# Patient Record
Sex: Male | Born: 1970 | Race: White | Hispanic: No | Marital: Single | Smoking: Current every day smoker
Health system: Southern US, Community
[De-identification: ages and names within clinical notes are randomized; demographics above are authoritative.]

## PROBLEM LIST (undated history)

## (undated) DIAGNOSIS — G4719 Other hypersomnia: Secondary | ICD-10-CM

## (undated) DIAGNOSIS — R200 Anesthesia of skin: Secondary | ICD-10-CM

## (undated) DIAGNOSIS — I10 Essential (primary) hypertension: Secondary | ICD-10-CM

---
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
## 2007-10-24 ENCOUNTER — Emergency Department: Payer: Self-pay | Admitting: Emergency Medicine

## 2017-01-13 ENCOUNTER — Other Ambulatory Visit: Payer: Self-pay

## 2017-01-13 ENCOUNTER — Ambulatory Visit
Admission: EM | Admit: 2017-01-13 | Discharge: 2017-01-13 | Disposition: A | Discharge status: Home / Self Care | Payer: BC Managed Care – PPO | Attending: Family Medicine | Admitting: Family Medicine

## 2017-01-13 ENCOUNTER — Encounter: Payer: Self-pay | Admitting: Emergency Medicine

## 2017-01-13 DIAGNOSIS — J069 Acute upper respiratory infection, unspecified: Secondary | ICD-10-CM | POA: Diagnosis not present

## 2017-01-13 DIAGNOSIS — H6091 Unspecified otitis externa, right ear: Secondary | ICD-10-CM

## 2017-01-13 DIAGNOSIS — H60501 Unspecified acute noninfective otitis externa, right ear: Secondary | ICD-10-CM

## 2017-01-13 DIAGNOSIS — H6691 Otitis media, unspecified, right ear: Secondary | ICD-10-CM | POA: Diagnosis not present

## 2017-01-13 MED ORDER — AMOXICILLIN 875 MG PO TABS: 875.0000 mg | ORAL_TABLET | Freq: Two times a day (BID) | ORAL | 0 refills | Status: DC

## 2017-01-13 MED ORDER — BENZONATATE 100 MG PO CAPS: 100.0000 mg | ORAL_CAPSULE | Freq: Three times a day (TID) | ORAL | 0 refills | Status: DC | PRN

## 2017-01-13 MED ORDER — CIPROFLOXACIN-DEXAMETHASONE 0.3-0.1 % OT SUSP: 4.0000 [drp] | Freq: Two times a day (BID) | OTIC | 0 refills | Status: DC

## 2017-01-13 NOTE — ED Triage Notes (Signed)
Patient c/o sinus pain and pressure that started on Friday.

## 2017-01-13 NOTE — ED Provider Notes (Signed)
[facility] ____________________________________________  Time seen: Approximately 2:43 PM  I have reviewed the triage vital signs and the nursing notes.   HISTORY  Chief Complaint Sinus Problem   HPI Dominic Barton is a 46 y.o. male presenting for evaluation of 3 days of runny nose, nasal congestion, bilateral ear discomfort and cough.  Also reports some sore throat.  States right ear with mild to moderate pain intermittently with some decreased hearing.  Denies trauma, drainage or bleeding.  States unresolved with over-the-counter cough and congestion medications.  Patient reports that he does sometimes have sinus issues, and reports the last few weeks he has noticed some occasional right ear pain, but no right ear continued discomfort.  Denies any known sick contacts.  Reports continues to eat and drink well.  Denies known fevers.  Reports is continue to remain active.  Denies other aggravating or alleviating factors.Denies chest pain, shortness of breath, abdominal pain,or rash. Denies recent sickness. Denies recent antibiotic use.     History reviewed. No pertinent past medical history. Denies  There are no active problems to display for this patient.   History reviewed. No pertinent surgical history.   No current facility-administered medications for this encounter.   Current Outpatient Medications:  .  amoxicillin (AMOXIL) 875 MG tablet, Take 1 tablet (875 mg total) 2 (two) times daily by mouth., Disp: 20 tablet, Rfl: 0 .  benzonatate (TESSALON PERLES) 100 MG capsule, Take 1 capsule (100 mg total) 3 (three) times daily as needed by mouth for cough., Disp: 15 capsule, Rfl: 0 .  ciprofloxacin-dexamethasone (CIPRODEX) OTIC suspension, Place 4 drops 2 (two) times daily into the right ear. For 7 days, Disp: 7.5 mL, Rfl: 0  Allergies Patient has no known allergies.  History reviewed. No pertinent family history.  Social History Social History   Tobacco Use    . Smoking status: Never Smoker  . Smokeless tobacco: Never Used  Substance Use Topics  . Alcohol use: No    Frequency: Never  . Drug use: No    Review of Systems Constitutional: No fever/chills ENT: As above.  Cardiovascular: Denies chest pain. Respiratory: Denies shortness of breath. Gastrointestinal: No abdominal pain.  Musculoskeletal: Negative for back pain. Skin: Negative for rash.   ____________________________________________   PHYSICAL EXAM:  VITAL SIGNS: ED Triage Vitals  Enc Vitals Group     BP 01/13/17 1211 (!) 142/80     Pulse Rate 01/13/17 1211 88     Resp 01/13/17 1211 16     Temp 01/13/17 1211 99.3 F (37.4 C)     Temp Source 01/13/17 1211 Oral     SpO2 01/13/17 1211 98 %     Weight 01/13/17 1209 235 lb (106.6 kg)     Height 01/13/17 1209 5\' 7"  (1.702 m)     Head Circumference --      Peak Flow --      Pain Score 01/13/17 1210 0     Pain Loc --      Pain Edu? --      Excl. in GC? --     Constitutional: Alert and oriented. Well appearing and in no acute distress. Eyes: Conjunctivae are normal.  Head: Atraumatic.Minimal tenderness to palpation bilateral frontal and maxillary sinuses. No swelling. No erythema.   Ears: Left: Nontender, normal canal, no erythema, normal TM.  Right: Nontender, mild swelling and drainage in canal, moderate TM erythema, unable to fully visualize full TM no TM rupture visible.  No surrounding tenderness, swelling  or erythema bilaterally.  No mastoid tenderness bilaterally.  Nose: nasal congestion with bilateral nasal turbinate erythema and edema.   Mouth/Throat: Mucous membranes are moist.  Oropharynx non-erythematous.No tonsillar swelling or exudate.  Neck: No stridor.  No cervical spine tenderness to palpation. Hematological/Lymphatic/Immunilogical: No cervical lymphadenopathy. Cardiovascular: Normal rate, regular rhythm. Grossly normal heart sounds.  Good peripheral circulation. Respiratory: Normal respiratory effort.   No retractions.  No wheezes, rales or rhonchi. Good air movement.  Musculoskeletal: No cervical, thoracic or lumbar tenderness to palpation.  Neurologic:  Normal speech and language. No gross focal neurologic deficits are appreciated. No gait instability. Skin:  Skin is warm, dry and intact. No rash noted. Psychiatric: Mood and affect are normal. Speech and behavior are normal.  ___________________________________________   LABS (all labs ordered are listed, but only abnormal results are displayed)  Labs Reviewed - No data to display ____________________________________________   PROCEDURES Procedures   INITIAL IMPRESSION / ASSESSMENT AND PLAN / ED COURSE  Pertinent labs & imaging results that were available during my care of the patient were reviewed by me and considered in my medical decision making (see chart for details).  Well-appearing patient.  No acute distress.  Suspect viral upper respiratory infection.  Also noted right otitis externa and right otitis media.  Will treat with oral amoxicillin and Ciprodex.  Encouraged rest, fluids, supportive care.Discussed indication, risks and benefits of medications with patient.  Discussed follow up with Primary care physician this week. Discussed follow up and return parameters including no resolution or any worsening concerns. Patient verbalized understanding and agreed to plan.   ____________________________________________   FINAL CLINICAL IMPRESSION(S) / ED DIAGNOSES  Final diagnoses:  Right otitis media, unspecified otitis media type  Acute otitis externa of right ear, unspecified type  Acute upper respiratory infection     ED Discharge Orders        Ordered    benzonatate (TESSALON PERLES) 100 MG capsule  3 times daily PRN     01/13/17 1242    amoxicillin (AMOXIL) 875 MG tablet  2 times daily     01/13/17 1242    ciprofloxacin-dexamethasone (CIPRODEX) OTIC suspension  2 times daily     01/13/17 1242       Note:  This dictation was prepared with Dragon dictation along with smaller phrase technology. Any transcriptional errors that result from this process are unintentional.         Renford DillsMiller, Sakira Dahmer, NP 01/13/17 1447

## 2017-01-13 NOTE — Discharge Instructions (Signed)
Take medication as prescribed. Rest. Drink plenty of fluids.  ° °Follow up with your primary care physician this week as needed. Return to Urgent care for new or worsening concerns.  ° °

## 2019-02-26 DIAGNOSIS — M778 Other enthesopathies, not elsewhere classified: Secondary | ICD-10-CM

## 2019-02-26 HISTORY — DX: Other enthesopathies, not elsewhere classified: M77.8

## 2020-01-26 DIAGNOSIS — D649 Anemia, unspecified: Secondary | ICD-10-CM

## 2020-01-26 HISTORY — DX: Anemia, unspecified: D64.9

## 2020-02-23 ENCOUNTER — Other Ambulatory Visit: Payer: Self-pay | Admitting: Surgery

## 2020-03-02 ENCOUNTER — Encounter
Admission: RE | Admit: 2020-03-02 | Discharge: 2020-03-02 | Disposition: A | Discharge status: Home / Self Care | Payer: BC Managed Care – PPO | Source: Ambulatory Visit | Attending: Surgery | Admitting: Surgery

## 2020-03-02 ENCOUNTER — Other Ambulatory Visit: Payer: Self-pay

## 2020-03-02 DIAGNOSIS — G4730 Sleep apnea, unspecified: Secondary | ICD-10-CM

## 2020-03-02 HISTORY — DX: Anesthesia of skin: R20.0

## 2020-03-02 HISTORY — DX: Essential (primary) hypertension: I10

## 2020-03-02 HISTORY — DX: Sleep apnea, unspecified: G47.30

## 2020-03-02 HISTORY — DX: Other hypersomnia: G47.19

## 2020-03-02 NOTE — Patient Instructions (Addendum)
INSTRUCTIONS FOR SURGERY     Your surgery is scheduled for:   Wednesday, January 12TH, 2022     To find out your arrival time for the day of surgery,          please call [phone] between 1 pm and 3 pm on :  Tuesday, January 11TH, 2022     When you arrive for surgery, report to THE [facility]ALL. Registration will check you in then     Will send you to the second floor surgical desk. Check in there for surgery.         REMEMBER: Instructions that are not followed completely may result in serious medical risk,  up to and including death, or upon the discretion of your surgeon and anesthesiologist,            your surgery may need to be rescheduled.  __X__ 1. Do not eat food after midnight the night before your procedure.                    No gum, candy, lozenger, tic tacs, tums or hard candies.                  ABSOLUTELY NOTHING SOLID IN YOUR MOUTH AFTER MIDNIGHT                    You may drink unlimited clear liquids up to 2 hours before you are scheduled to arrive for surgery.                   Do not drink anything within those 2 hours unless you need to take medicine, then take the                   smallest amount you need.  Clear liquids include:  water, apple juice without pulp,                   any flavor Gatorade, Black coffee, black tea.  Sugar may be added but no dairy/ honey /lemon.                        Broth and jello is not considered a clear liquid.  __x__  2. On the morning of surgery, please brush your teeth with toothpaste and water. You may rinse with                  mouthwash if you wish but DO NOT SWALLOW TOOTHPASTE OR MOUTHWASH  __X___3. NO alcohol for 24 hours before or after surgery.  __x___ 4.  Do NOT smoke or use e-cigarettes for 24 HOURS PRIOR TO SURGERY.                      DO NOT Use any chewable tobacco products for at least 6 hours prior to surgery.  __x___ 5. If you start any new  medication after this appointment and prior to surgery, please                   Bring it with you on the day of surgery.  ___x__ 6. Notify your doctor if there is any change in your medical condition, such as fever,                  infection, vomitting, diarrhea or any open sores.  __x___ 7.  USE the CHG SOAP as instructed, the night before surgery and the day of surgery.                   Once you have washed with this soap, do NOT use any of the following: Powders, perfumes                    or lotions. Please do not wear make up, hairpins, clips or nail polish. You may wear deodorant.                   Men may shave their face and neck.  Women need to shave 48 hours prior to surgery.                   DO NOT wear ANY jewelry on the day of surgery. If there are rings that are too tight to                    remove easily, please address this prior to the surgery day. Piercings need to be removed.                                                                     NO METAL ON YOUR BODY.                    Do NOT bring any valuables.  If you came to Pre-Admit testing then you will not need license,                     insurance card or credit card.  If you will be staying overnight, please either leave your things in                     the car or have your family be responsible for these items.                     [facility] IS NOT RESPONSIBLE FOR BELONGINGS OR VALUABLES.  ___X__ 8. DO NOT wear contact lenses on surgery day.  You may not have dentures,                     Hearing aides, contacts or glasses in the operating room. These items can be                    Placed in the Recovery Room to receive immediately after surgery.  __x___ 9. IF YOU ARE SCHEDULED TO GO HOME ON THE SAME DAY, YOU MUST                   Have someone to drive you home and to stay with you  for the first 24 hours.                    Have an arrangement prior to arriving on  surgery day.  ___x__ 10. Take the  following medications on the morning of surgery with a sip of water:                              1.  nothing                     2.  __x___ 11.  Follow any instructions provided to you by your surgeon.                        Such as enema, clear liquid bowel prep                    PLEASE COMPLETE THE CARBOHYDRATE PRESURGICAL DRINK BY                      TWO HOURS PRIOR TO ARRIVAL TO THE HOSPITAL.  __X__  12. STOP ALL ASPIRIN PRODUCTS AS OF January 6TH, 2022.                       THIS INCLUDES BC POWDERS / GOODIES POWDER  __x___ 13. STOP Anti-inflammatories as of  January 6TH, 2022                      This includes IBUPROFEN / MOTRIN / ADVIL / ALEVE/ NAPROXYN                         MOBIC ALSO                    YOU MAY TAKE TYLENOL ANY TIME PRIOR TO SURGERY.  ___X__ 14.  Stop supplements until after surgery.                     This includes: VITAMIN C                 You may continue taking Vitamin B12 / MULTIVITAMINS but do not take                  on the morning of surgery.  ___X___17.  Continue to take the following medications but do not take on the morning of surgery:                          HYZAAR AND VITAMINS  ___X__18.   Wear clean and comfortable clothing to the hospital.                    HAVE A SHIRT WITH LOOSE OR STRETCHY CUFFS.

## 2020-03-06 ENCOUNTER — Other Ambulatory Visit: Payer: Self-pay

## 2020-03-06 ENCOUNTER — Other Ambulatory Visit
Admission: RE | Admit: 2020-03-06 | Discharge: 2020-03-06 | Disposition: A | Discharge status: Home / Self Care | Payer: BC Managed Care – PPO | Source: Ambulatory Visit | Attending: Surgery | Admitting: Surgery

## 2020-03-06 DIAGNOSIS — R7303 Prediabetes: Secondary | ICD-10-CM | POA: Diagnosis not present

## 2020-03-06 DIAGNOSIS — Z20822 Contact with and (suspected) exposure to covid-19: Secondary | ICD-10-CM | POA: Insufficient documentation

## 2020-03-06 DIAGNOSIS — Z01818 Encounter for other preprocedural examination: Secondary | ICD-10-CM | POA: Insufficient documentation

## 2020-03-06 DIAGNOSIS — G5602 Carpal tunnel syndrome, left upper limb: Secondary | ICD-10-CM | POA: Diagnosis not present

## 2020-03-06 DIAGNOSIS — I10 Essential (primary) hypertension: Secondary | ICD-10-CM | POA: Insufficient documentation

## 2020-03-06 DIAGNOSIS — Z79899 Other long term (current) drug therapy: Secondary | ICD-10-CM | POA: Diagnosis not present

## 2020-03-06 LAB — SARS CORONAVIRUS 2 (TAT 6-24 HRS): SARS Coronavirus 2: NEGATIVE

## 2020-03-08 ENCOUNTER — Ambulatory Visit: Payer: BC Managed Care – PPO | Admitting: Urgent Care

## 2020-03-08 ENCOUNTER — Encounter: Payer: Self-pay | Admitting: Surgery

## 2020-03-08 ENCOUNTER — Ambulatory Visit: Payer: BC Managed Care – PPO | Admitting: Certified Registered"

## 2020-03-08 ENCOUNTER — Encounter
Admission: RE | Disposition: A | Discharge status: Home / Self Care | Payer: Self-pay | Source: Home / Self Care | Attending: Surgery

## 2020-03-08 ENCOUNTER — Other Ambulatory Visit: Payer: Self-pay

## 2020-03-08 ENCOUNTER — Ambulatory Visit
Admission: RE | Admit: 2020-03-08 | Discharge: 2020-03-08 | Disposition: A | Discharge status: Home / Self Care | Payer: BC Managed Care – PPO | Attending: Surgery | Admitting: Surgery

## 2020-03-08 DIAGNOSIS — G5602 Carpal tunnel syndrome, left upper limb: Secondary | ICD-10-CM | POA: Diagnosis not present

## 2020-03-08 DIAGNOSIS — Z20822 Contact with and (suspected) exposure to covid-19: Secondary | ICD-10-CM | POA: Insufficient documentation

## 2020-03-08 DIAGNOSIS — Z79899 Other long term (current) drug therapy: Secondary | ICD-10-CM | POA: Insufficient documentation

## 2020-03-08 DIAGNOSIS — R7303 Prediabetes: Secondary | ICD-10-CM | POA: Insufficient documentation

## 2020-03-08 HISTORY — PX: CARPAL TUNNEL RELEASE: SHX101

## 2020-03-08 SURGERY — RELEASE, CARPAL TUNNEL, ENDOSCOPIC
Anesthesia: General | Site: Wrist | Laterality: Left

## 2020-03-08 MED ORDER — CHLORHEXIDINE GLUCONATE 0.12 % MT SOLN: 15.0000 mL | Freq: Once | OROMUCOSAL | Status: AC

## 2020-03-08 MED ORDER — FENTANYL CITRATE (PF) 100 MCG/2ML IJ SOLN
INTRAMUSCULAR | Status: AC
  Filled 2020-03-08: qty 2

## 2020-03-08 MED ORDER — PROPOFOL 10 MG/ML IV BOLUS
INTRAVENOUS | Status: DC | PRN
  Administered 2020-03-08 (×2): 50 mg via INTRAVENOUS
  Administered 2020-03-08: 200 mg via INTRAVENOUS

## 2020-03-08 MED ORDER — FENTANYL CITRATE (PF) 100 MCG/2ML IJ SOLN
INTRAMUSCULAR | Status: DC | PRN
  Administered 2020-03-08 (×2): 50 ug via INTRAVENOUS

## 2020-03-08 MED ORDER — LACTATED RINGERS IV SOLN: INTRAVENOUS | Status: DC

## 2020-03-08 MED ORDER — CEFAZOLIN SODIUM-DEXTROSE 2-4 GM/100ML-% IV SOLN
INTRAVENOUS | Status: AC
  Filled 2020-03-08: qty 100

## 2020-03-08 MED ORDER — ONDANSETRON HCL 4 MG/2ML IJ SOLN
INTRAMUSCULAR | Status: DC | PRN
  Administered 2020-03-08: 4 mg via INTRAVENOUS

## 2020-03-08 MED ORDER — PROPOFOL 10 MG/ML IV BOLUS
INTRAVENOUS | Status: AC
  Filled 2020-03-08: qty 20

## 2020-03-08 MED ORDER — OXYCODONE HCL 5 MG PO TABS: 5.0000 mg | ORAL_TABLET | Freq: Once | ORAL | Status: DC | PRN

## 2020-03-08 MED ORDER — FAMOTIDINE 20 MG PO TABS
ORAL_TABLET | ORAL | Status: AC
  Administered 2020-03-08: 20 mg via ORAL
  Filled 2020-03-08: qty 1

## 2020-03-08 MED ORDER — LIDOCAINE HCL (CARDIAC) PF 100 MG/5ML IV SOSY
PREFILLED_SYRINGE | INTRAVENOUS | Status: DC | PRN
  Administered 2020-03-08: 100 mg via INTRAVENOUS

## 2020-03-08 MED ORDER — ONDANSETRON HCL 4 MG PO TABS: 4.0000 mg | ORAL_TABLET | Freq: Four times a day (QID) | ORAL | Status: DC | PRN

## 2020-03-08 MED ORDER — MIDAZOLAM HCL 2 MG/2ML IJ SOLN
INTRAMUSCULAR | Status: AC
  Filled 2020-03-08: qty 2

## 2020-03-08 MED ORDER — MIDAZOLAM HCL 2 MG/2ML IJ SOLN
INTRAMUSCULAR | Status: DC | PRN
  Administered 2020-03-08: 2 mg via INTRAVENOUS

## 2020-03-08 MED ORDER — BUPIVACAINE HCL (PF) 0.5 % IJ SOLN
INTRAMUSCULAR | Status: DC | PRN
  Administered 2020-03-08: 10 mL

## 2020-03-08 MED ORDER — FAMOTIDINE 20 MG PO TABS: 20.0000 mg | ORAL_TABLET | Freq: Once | ORAL | Status: AC

## 2020-03-08 MED ORDER — CEFAZOLIN SODIUM-DEXTROSE 2-4 GM/100ML-% IV SOLN
2.0000 g | INTRAVENOUS | Status: AC
  Administered 2020-03-08: 2 g via INTRAVENOUS

## 2020-03-08 MED ORDER — HYDROMORPHONE HCL 1 MG/ML IJ SOLN: 0.2500 mg | INTRAMUSCULAR | Status: DC | PRN

## 2020-03-08 MED ORDER — PROMETHAZINE HCL 25 MG/ML IJ SOLN: 6.2500 mg | INTRAMUSCULAR | Status: DC | PRN

## 2020-03-08 MED ORDER — METOCLOPRAMIDE HCL 10 MG PO TABS: 5.0000 mg | ORAL_TABLET | Freq: Three times a day (TID) | ORAL | Status: DC | PRN

## 2020-03-08 MED ORDER — METOCLOPRAMIDE HCL 5 MG/ML IJ SOLN: 5.0000 mg | Freq: Three times a day (TID) | INTRAMUSCULAR | Status: DC | PRN

## 2020-03-08 MED ORDER — DROPERIDOL 2.5 MG/ML IJ SOLN
0.6250 mg | Freq: Once | INTRAMUSCULAR | Status: DC | PRN
  Filled 2020-03-08: qty 2

## 2020-03-08 MED ORDER — ONDANSETRON HCL 4 MG/2ML IJ SOLN: 4.0000 mg | Freq: Four times a day (QID) | INTRAMUSCULAR | Status: DC | PRN

## 2020-03-08 MED ORDER — LORAZEPAM 2 MG/ML IJ SOLN: 1.0000 mg | Freq: Once | INTRAMUSCULAR | Status: DC | PRN

## 2020-03-08 MED ORDER — MEPERIDINE HCL 50 MG/ML IJ SOLN: 6.2500 mg | INTRAMUSCULAR | Status: DC | PRN

## 2020-03-08 MED ORDER — POTASSIUM CHLORIDE IN NACL 20-0.9 MEQ/L-% IV SOLN: INTRAVENOUS | Status: DC

## 2020-03-08 MED ORDER — ORAL CARE MOUTH RINSE: 15.0000 mL | Freq: Once | OROMUCOSAL | Status: AC

## 2020-03-08 MED ORDER — CHLORHEXIDINE GLUCONATE 0.12 % MT SOLN
OROMUCOSAL | Status: AC
  Administered 2020-03-08: 15 mL via OROMUCOSAL
  Filled 2020-03-08: qty 15

## 2020-03-08 MED ORDER — OXYCODONE HCL 5 MG/5ML PO SOLN: 5.0000 mg | Freq: Once | ORAL | Status: DC | PRN

## 2020-03-08 MED ORDER — PROPOFOL 10 MG/ML IV BOLUS
INTRAVENOUS | Status: AC
  Filled 2020-03-08: qty 40

## 2020-03-08 MED ORDER — TRAMADOL HCL 50 MG PO TABS: 50.0000 mg | ORAL_TABLET | Freq: Four times a day (QID) | ORAL | Status: DC | PRN

## 2020-03-08 MED ORDER — LACTATED RINGERS IV SOLN: INTRAVENOUS | Status: DC | PRN

## 2020-03-08 SURGICAL SUPPLY — 33 items
APL PRP STRL LF DISP 70% ISPRP (MISCELLANEOUS) ×1
BNDG COHESIVE 4X5 TAN STRL (GAUZE/BANDAGES/DRESSINGS) ×2 IMPLANT
BNDG ELASTIC 2X5.8 VLCR STR LF (GAUZE/BANDAGES/DRESSINGS) ×2 IMPLANT
BNDG ESMARK 4X12 TAN STRL LF (GAUZE/BANDAGES/DRESSINGS) ×2 IMPLANT
CANISTER SUCT 1200ML W/VALVE (MISCELLANEOUS) ×2 IMPLANT
CHLORAPREP W/TINT 26 (MISCELLANEOUS) ×2 IMPLANT
CORD BIP STRL DISP 12FT (MISCELLANEOUS) ×2 IMPLANT
COVER WAND RF STERILE (DRAPES) ×2 IMPLANT
CUFF TOURN SGL QUICK 18X4 (TOURNIQUET CUFF) ×2 IMPLANT
DRAPE SURG 17X11 SM STRL (DRAPES) ×2 IMPLANT
FORCEPS JEWEL BIP 4-3/4 STR (INSTRUMENTS) ×2 IMPLANT
GAUZE SPONGE 4X4 12PLY STRL (GAUZE/BANDAGES/DRESSINGS) ×2 IMPLANT
GAUZE XEROFORM 1X8 LF (GAUZE/BANDAGES/DRESSINGS) ×2 IMPLANT
GLOVE BIO SURGEON STRL SZ8 (GLOVE) ×2 IMPLANT
GLOVE INDICATOR 8.0 STRL GRN (GLOVE) ×2 IMPLANT
GOWN STRL REUS W/ TWL LRG LVL3 (GOWN DISPOSABLE) ×1 IMPLANT
GOWN STRL REUS W/ TWL XL LVL3 (GOWN DISPOSABLE) ×1 IMPLANT
GOWN STRL REUS W/TWL LRG LVL3 (GOWN DISPOSABLE) ×2
GOWN STRL REUS W/TWL XL LVL3 (GOWN DISPOSABLE) ×2
KIT CARPAL TUNNEL (MISCELLANEOUS) ×2
KIT ESCP INSRT D SLOT CANN KN (MISCELLANEOUS) ×1 IMPLANT
KIT TURNOVER KIT A (KITS) ×2 IMPLANT
MANIFOLD NEPTUNE II (INSTRUMENTS) ×2 IMPLANT
NS IRRIG 500ML POUR BTL (IV SOLUTION) ×2 IMPLANT
PACK EXTREMITY ARMC (MISCELLANEOUS) ×2 IMPLANT
SPLINT WRIST LG LT TX990309 (SOFTGOODS) IMPLANT
SPLINT WRIST LG RT TX900304 (SOFTGOODS) IMPLANT
SPLINT WRIST M LT TX990308 (SOFTGOODS) IMPLANT
SPLINT WRIST M RT TX990303 (SOFTGOODS) IMPLANT
SPLINT WRIST XL LT TX990310 (SOFTGOODS) ×2 IMPLANT
SPLINT WRIST XL RT TX990305 (SOFTGOODS) IMPLANT
STOCKINETTE IMPERVIOUS 9X36 MD (GAUZE/BANDAGES/DRESSINGS) ×2 IMPLANT
SUT PROLENE 4 0 PS 2 18 (SUTURE) ×2 IMPLANT

## 2020-03-08 NOTE — Discharge Instructions (Addendum)
Orthopedic discharge instructions: Keep dressing dry and intact. Keep hand elevated above heart level. May shower after dressing removed on postop day 4 (Monday). Cover sutures with Band-Aids after drying off. Apply ice to affected area frequently. Take Mobic 15 mg daily OR ibuprofen 600 mg TID with meals for 7-10 days, then as necessary. Take ES Tylenol or pain medication as prescribed when needed.  Return for follow-up in 10-14 days or as scheduled.

## 2020-03-08 NOTE — Transfer of Care (Signed)
Immediate Anesthesia Transfer of Care Note  Patient: Dominic Barton  Procedure(s) Performed: CARPAL TUNNEL RELEASE ENDOSCOPIC (Left Wrist)  Patient Location: PACU  Anesthesia Type:General  Level of Consciousness: awake, alert  and oriented  Airway & Oxygen Therapy: Patient Spontanous Breathing and Patient connected to face mask oxygen  Post-op Assessment: Report given to RN and Post -op Vital signs reviewed and stable  Post vital signs: Reviewed and stable  Last Vitals:  Vitals Value Taken Time  BP 151/85 03/08/20 1605  Temp    Pulse 78 03/08/20 1614  Resp 18 03/08/20 1614  SpO2 100 % 03/08/20 1614  Vitals shown include unvalidated device data.  Last Pain:  Vitals:   03/08/20 1356  TempSrc: Tympanic  PainSc: 0-No pain         Complications: No complications documented.

## 2020-03-08 NOTE — Op Note (Signed)
03/08/2020  3:55 PM  Patient:   Dominic Barton  Pre-Op Diagnosis:   Left carpal tunnel syndrome.  Post-Op Diagnosis:   Same.  Procedure:   Endoscopic left carpal tunnel release.  Surgeon:   Maryagnes Amos, MD  Anesthesia:   General LMA  Findings:   As above.  Complications:   None  EBL:   0 cc  Fluids:   350 cc crystalloid  TT:   18 minutes at 250 mmHg  Drains:   None  Closure:   4-0 Prolene interrupted sutures  Brief Clinical Note:   The patient is a 51 year old male with a long history of progressively worsening pain and paresthesias to his left hand. His symptoms have progressed despite medications, activity modification, etc. His history and examination are consistent with carpal tunnel syndrome, confirmed by EMG. The patient presents at this time for an endoscopic left carpal tunnel release.   Procedure:   The patient was brought into the operating room and lain in the supine position. After adequate general laryngeal mask anesthesia was obtained, the left hand and upper extremity were prepped with ChloraPrep solution before being draped sterilely. Preoperative antibiotics were administered. A timeout was performed to verify the appropriate surgical site before the limb was exsanguinated with an Esmarch and the tourniquet inflated to 250 mmHg. An approximately 1.5-2 cm incision was made over the volar wrist flexion crease, centered over the palmaris longus tendon. The incision was carried down through the subcutaneous tissues with care taken to identify and protect any neurovascular structures. The distal forearm fascia was penetrated just proximal to the transverse carpal ligament. The soft tissues were released off the superficial and deep surfaces of the distal forearm fascia and this was released proximally for 3-4 cm under direct visualization.  Attention was directed distally. The Therapist, nutritional was passed beneath the transverse carpal ligament along the ulnar aspect  of the carpal tunnel and used to release any adhesions as well as to remove any adherent synovial tissue before first the smaller then the larger of the two dilators were passed beneath the transverse carpal ligament along the ulnar margin of the carpal tunnel. The slotted cannula was introduced and the endoscope was placed into the slotted cannula and the undersurface of the transverse carpal ligament visualized. The distal margin of the transverse carpal ligament was marked by placing a 25-gauge needle percutaneously at Kaplan's cardinal point so that it entered the distal portion of the slotted cannula. Under endoscopic visualization, the transverse carpal ligament was released from proximal to distal using the end-cutting blade. A second pass was performed to ensure complete release of the ligament. The adequacy of release was verified both endoscopically and by palpation using the freer elevator.  The wound was irrigated thoroughly with sterile saline solution before being closed using 4-0 Prolene interrupted sutures. A total of 10 cc of 0.5% plain Sensorcaine was injected in and around the incision before a sterile bulky dressing was applied to the wound. The patient was placed into a volar wrist splint before being awakened and returned to the recovery room in satisfactory condition after tolerating the procedure well.

## 2020-03-08 NOTE — Anesthesia Postprocedure Evaluation (Signed)
Anesthesia Post Note  Patient: Dominic Barton  Procedure(s) Performed: CARPAL TUNNEL RELEASE ENDOSCOPIC (Left Wrist)  Patient location during evaluation: PACU Anesthesia Type: General Level of consciousness: awake and alert Pain management: pain level controlled Vital Signs Assessment: post-procedure vital signs reviewed and stable Respiratory status: spontaneous breathing, nonlabored ventilation, respiratory function stable and patient connected to nasal cannula oxygen Cardiovascular status: blood pressure returned to baseline and stable Postop Assessment: no apparent nausea or vomiting Anesthetic complications: no   No complications documented.   Last Vitals:  Vitals:   03/08/20 1650 03/08/20 1655  BP:  (!) 169/73  Pulse: 72 71  Resp: (!) 27 (!) 21  Temp: 36.6 C   SpO2: 100% 100%    Last Pain:  Vitals:   03/08/20 1638  TempSrc:   PainSc: 0-No pain                 Corinda Gubler

## 2020-03-08 NOTE — Anesthesia Preprocedure Evaluation (Signed)
Anesthesia Evaluation  Patient identified by MRN, date of birth, ID band Patient awake    Reviewed: Allergy & Precautions, H&P , NPO status , Patient's Chart, lab work & pertinent test results  Airway Mallampati: II       Dental no notable dental hx.    Pulmonary sleep apnea , Current Smoker and Patient abstained from smoking.,    Pulmonary exam normal breath sounds clear to auscultation       Cardiovascular hypertension, negative cardio ROS Normal cardiovascular exam Rhythm:Regular Rate:Normal     Neuro/Psych negative neurological ROS  negative psych ROS   GI/Hepatic negative GI ROS, Neg liver ROS,   Endo/Other  negative endocrine ROS  Renal/GU negative Renal ROS  negative genitourinary   Musculoskeletal negative musculoskeletal ROS (+)   Abdominal   Peds negative pediatric ROS (+)  Hematology negative hematology ROS (+)   Anesthesia Other Findings Past Medical History: 01/2020: Anemia     Comment:  bitamin b12 deficiency No date: Bilateral hand numbness No date: Excessive daytime sleepiness No date: Hypertension 03/02/2020: Sleep apnea     Comment:  in the process of being worked up for sleep apnea 2021: Tendonitis of shoulder, left   Reproductive/Obstetrics negative OB ROS                             Anesthesia Physical Anesthesia Plan  ASA: II  Anesthesia Plan: General   Post-op Pain Management:    Induction: Intravenous  PONV Risk Score and Plan: 1 and Ondansetron and Dexamethasone  Airway Management Planned: LMA  Additional Equipment:   Intra-op Plan:   Post-operative Plan: Extubation in OR  Informed Consent: I have reviewed the patients History and Physical, chart, labs and discussed the procedure including the risks, benefits and alternatives for the proposed anesthesia with the patient or authorized representative who has indicated his/her understanding and  acceptance.       Plan Discussed with: CRNA, Anesthesiologist and Surgeon  Anesthesia Plan Comments:         Anesthesia Quick Evaluation

## 2020-03-08 NOTE — H&P (Signed)
History of Present Illness:  Dominic Barton is a 50 y.o. male who has been referred by Dedra Skeens, PA-C, for evaluation and treatment of his bilateral hand and wrist pain and paresthesias. The patient notes that the symptoms have been present for many years, but have worsened over the past few months. 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 He notes that his symptoms are aggravated by these repetitive activities, especially while at work. He notes that even smaller activities will aggravate his symptoms and contribute to numbness. The patient also notes occasional paresthesias at night which he attributes to positioning his arm while sleeping. He saw Dedra Skeens, PA-C, for these symptoms who sent him for an EMG of both upper extremities and referred him to me for further evaluation and treatment. The patient denies any neck pain, but is a borderline diabetic.  Current Outpatient Medications:  . losartan-hydrochlorothiazide (HYZAAR) 50-12.5 mg tablet Take 1 tablet by mouth once daily 90 tablet 1  . multivitamin tablet Take 1 tablet by mouth once daily   Allergies: No Known Allergies  Past Medical History:  . Chronic left shoulder pain 01/03/2020  . Essential hypertension 01/03/2020  . Excessive daytime sleepiness 01/03/2020  . Paresthesia 01/03/2020   Surgical History: No past surgical history on file.   Family History  . No Known Problems Mother  . No Known Problems Father   Social History:   Socioeconomic History:  Marland Kitchen Marital status: Single  Spouse name: Not on file  . Number of children: 1  . Years of education: Not on file  . Highest education level: Not on file  Occupational History  . 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 Smoking status: Current Every Day Smoker  Packs/day: 1.50  Types: Cigarettes  .  Smokeless tobacco: Former Clinical biochemist  . Vaping Use: Never used  Substance and Sexual Activity  . Alcohol use: Yes  Alcohol/week: 7.0 standard drinks  Types: 7 Cans of beer per week  . Drug use: Never  . Sexual activity: Yes  Other Topics Concern  . Not on file  Social History Narrative  . Not on file   Social Determinants of Health:   Financial Resource Strain: Not on file  Food Insecurity: Not on file  Transportation Needs: Not on file   Review of Systems:  A comprehensive 14 point ROS was performed, reviewed, and the pertinent orthopaedic findings are documented in the HPI.  Physical Exam: Vitals:  02/21/20 0913  BP: 126/80  Weight: (!) 111.7 kg (246 lb 3.2 oz)  Height: 168.9 cm (5' 6.5")  PainSc: 0-No pain  PainLoc: Hand   General/Constitutional: Pleasant husky middle-aged male in no acute distress. Neuro/Psych: Normal mood and affect, oriented to person, place and time. Eyes: Non-icteric. Pupils are equal, round, and reactive to light, and exhibit synchronous movement. ENT: Unremarkable. Lymphatic: No palpable adenopathy. Respiratory: Lungs clear to auscultation, Normal chest excursion, No wheezes and Non-labored breathing Cardiovascular: Regular rate and rhythm with soft holosystolic murmur and No edema, swelling or tenderness, except as noted in detailed exam. Integumentary: No impressive skin lesions present, except as noted in detailed exam. Musculoskeletal: Unremarkable, except as noted in detailed exam.  Left hand/wrist exam: Skin inspection of the left wrist and hand is unremarkable. No swelling, erythema, ecchymosis, abrasions, or other skin abnormalities are identified.  He is able to tolerate wrist flexion to 55 degrees and extension to 60 degrees without any pain or catching. He is able active flex extend all digits fully without any pain or triggering. He is neurovascularly intact to all digits, other than slightly decreased sensation subjectively to  the tips of his thumb, index, long, and ring fingers. He has a negative Phalen's test, but with a mildly positive Tinel's test at the wrist.  EMG results:  A recent EMG of both upper extremities is available for review. By report, the study demonstrates evidence of severe chronic bilateral carpal tunnel syndrome. These results were reviewed by myself and discussed with the patient.  Assessment: Carpal tunnel syndrome, left.   Plan: The treatment options were discussed with the patient. In addition, patient educational materials were provided regarding the diagnosis and treatment options. The patient is quite frustrated by his symptoms and functional limitations, and is ready to consider more aggressive treatment options. Therefore, I have recommended a surgical procedure, specifically an endoscopic left carpal tunnel release. The procedure was discussed with the patient, as were the potential risks (including bleeding, infection, nerve and/or blood vessel injury, persistent or recurrent pain/paresthesias, weakness of grip, need for further surgery, blood clots, strokes, heart attacks and/or arhythmias, pneumonia, etc.) and benefits. The patient states his/her understanding and wishes to proceed. All of the patient's questions and concerns were answered. He can call any time with further concerns. He will follow up post-surgery, routine.    H&P reviewed and patient re-examined. No changes.

## 2020-03-09 ENCOUNTER — Encounter: Payer: Self-pay | Admitting: Surgery

## 2020-03-29 ENCOUNTER — Other Ambulatory Visit: Payer: Self-pay | Admitting: Surgery

## 2020-03-30 ENCOUNTER — Encounter
Admission: RE | Admit: 2020-03-30 | Discharge: 2020-03-30 | Disposition: A | Discharge status: Home / Self Care | Payer: BC Managed Care – PPO | Source: Ambulatory Visit | Attending: Emergency Medicine | Admitting: Emergency Medicine

## 2020-03-30 DIAGNOSIS — Z01818 Encounter for other preprocedural examination: Secondary | ICD-10-CM | POA: Insufficient documentation

## 2020-03-30 NOTE — Patient Instructions (Addendum)
Your procedure is scheduled on: 02/09/22[facility] Report to the Registration Desk on the 1st floor of the Medical Mall. To find out your arrival time, please call [phone] between 1PM - 3PM on: 04/04/20- TUESDAY  REMEMBER: Instructions that are not followed completely may result in serious medical risk, up to and including death; or upon the discretion of your surgeon and anesthesiologist your surgery may need to be rescheduled.  Do not eat food OR DRINK ANY FLUIDS after midnight the night before surgery.  No gum chewing, lozengers or hard candies.   your doctor has ordered for you to drink the provided  Ensure Pre-Surgery Clear Carbohydrate Drink  Drinking this carbohydrate drink up to two hours before surgery helps to reduce insulin resistance and improve patient outcomes. Please complete drinking 2 hours prior to scheduled arrival time.  TAKE THESE MEDICATIONS THE MORNING OF SURGERY WITH A SIP OF WATER: NONE   One week prior to surgery: STOP TAKING 03/30/20: Stop Anti-inflammatories (NSAIDS) such as Advil, Aleve, Ibuprofen, MELOXICAM, Motrin, Naproxen, Naprosyn and Aspirin based products such as Excedrin, Goodys Powder, BC Powder.  Stop  ANY OVER THE COUNTER supplements until after surgery, Ascorbic Acid (VITAMIN C) 1000 MG tablet (However, you may continue taking Vitamin D, Vitamin B, and multivitamin up until the day before surgery.)  No Alcohol for 24 hours before or after surgery.  No Smoking including e-cigarettes for 24 hours prior to surgery.  No chewable tobacco products for at least 6 hours prior to surgery.  No nicotine patches on the day of surgery.  Do not use any "recreational" drugs for at least a week prior to your surgery.  Please be advised that the combination of cocaine and anesthesia may have negative outcomes, up to and including death. If you test positive for cocaine, your surgery will be cancelled.  On the morning of surgery brush your teeth with  toothpaste and water, you may rinse your mouth with mouthwash if you wish. Do not swallow any toothpaste or mouthwash.  Do not wear jewelry, make-up, hairpins, clips or nail polish.  Do not wear lotions, powders, or perfumes.   Do not shave body from the neck down 48 hours prior to surgery just in case you cut yourself which could leave a site for infection.  Also, freshly shaved skin may become irritated if using the CHG soap.  Contact lenses, hearing aids and dentures may not be worn into surgery.  Do not bring valuables to the hospital. [facility] is not responsible for any missing/lost belongings or valuables.   Use CHG Soap or wipes as directed on instruction sheet.  Notify your doctor if there is any change in your medical condition (cold, fever, infection).  Wear comfortable clothing (specific to your surgery type) to the hospital.  Plan for stool softeners for home use; pain medications have a tendency to cause constipation. You can also help prevent constipation by eating foods high in fiber such as fruits and vegetables and drinking plenty of fluids as your diet allows.  After surgery, you can help prevent lung complications by doing breathing exercises.  Take deep breaths and cough every 1-2 hours. Your doctor may order a device called an Incentive Spirometer to help you take deep breaths. When coughing or sneezing, hold a pillow firmly against your incision with both hands. This is called "splinting." Doing this helps protect your incision. It also decreases belly discomfort.  If you are being admitted to the hospital overnight, leave your suitcase  in the car. After surgery it may be brought to your room.  If you are being discharged the day of surgery, you will not be allowed to drive home. You will need a responsible adult (18 years or older) to drive you home and stay with you that night.   If you are taking public transportation, you will need to have a responsible  adult (18 years or older) with you. Please confirm with your physician that it is acceptable to use public transportation.   Please call the Pre-admissions Testing Dept. at [phone] if you have any questions about these instructions.  Visitation Policy:  Patients undergoing a surgery or procedure may have one family member or support person with them as long as that person is not COVID-19 positive or experiencing its symptoms.  That person may remain in the waiting area during the procedure.  Inpatient Visitation:    Visiting hours are 7 a.m. to 8 p.m. Patients will be allowed one visitor. The visitor may change daily. The visitor must pass COVID-19 screenings, use hand sanitizer when entering and exiting the patient's room and wear a mask at all times, including in the patient's room. Patients must also wear a mask when staff or their visitor are in the room. Masking is required regardless of vaccination status. Systemwide, no visitors 17 or younger.

## 2020-04-03 ENCOUNTER — Other Ambulatory Visit: Payer: Self-pay

## 2020-04-03 ENCOUNTER — Other Ambulatory Visit
Admission: RE | Admit: 2020-04-03 | Discharge: 2020-04-03 | Disposition: A | Discharge status: Home / Self Care | Payer: BC Managed Care – PPO | Source: Ambulatory Visit | Attending: Surgery | Admitting: Surgery

## 2020-04-03 DIAGNOSIS — Z01812 Encounter for preprocedural laboratory examination: Secondary | ICD-10-CM | POA: Diagnosis not present

## 2020-04-03 DIAGNOSIS — U071 COVID-19: Secondary | ICD-10-CM | POA: Insufficient documentation

## 2020-04-04 LAB — SARS CORONAVIRUS 2 (TAT 6-24 HRS): SARS Coronavirus 2: POSITIVE — AB

## 2020-04-06 ENCOUNTER — Telehealth: Payer: Self-pay | Admitting: *Deleted

## 2020-04-06 NOTE — Telephone Encounter (Signed)
Called to discuss with patient about COVID-19 symptoms and the use of one of the available treatments for those with mild to moderate Covid symptoms and at a high risk of hospitalization.  Pt appears to qualify for outpatient treatment due to co-morbid conditions and/or a member of an at-risk group in accordance with the FDA Emergency Use Authorization.    Patient reported no symptoms at all.    Symptom onset:  Vaccinated:  Booster?  Immunocompromised?  Qualifiers:     Dominic Barton

## 2020-04-13 ENCOUNTER — Encounter
Admission: RE | Disposition: A | Discharge status: Home / Self Care | Payer: Self-pay | Source: Home / Self Care | Attending: Surgery

## 2020-04-13 ENCOUNTER — Ambulatory Visit
Admission: RE | Admit: 2020-04-13 | Discharge: 2020-04-13 | Disposition: A | Discharge status: Home / Self Care | Payer: BC Managed Care – PPO | Attending: Surgery | Admitting: Surgery

## 2020-04-13 ENCOUNTER — Ambulatory Visit: Payer: BC Managed Care – PPO | Admitting: Certified Registered Nurse Anesthetist

## 2020-04-13 ENCOUNTER — Encounter: Payer: Self-pay | Admitting: Surgery

## 2020-04-13 ENCOUNTER — Other Ambulatory Visit: Payer: Self-pay

## 2020-04-13 DIAGNOSIS — G5601 Carpal tunnel syndrome, right upper limb: Secondary | ICD-10-CM | POA: Diagnosis present

## 2020-04-13 DIAGNOSIS — F17210 Nicotine dependence, cigarettes, uncomplicated: Secondary | ICD-10-CM | POA: Diagnosis not present

## 2020-04-13 DIAGNOSIS — Z791 Long term (current) use of non-steroidal anti-inflammatories (NSAID): Secondary | ICD-10-CM | POA: Diagnosis not present

## 2020-04-13 DIAGNOSIS — Z79899 Other long term (current) drug therapy: Secondary | ICD-10-CM | POA: Diagnosis not present

## 2020-04-13 DIAGNOSIS — E119 Type 2 diabetes mellitus without complications: Secondary | ICD-10-CM | POA: Insufficient documentation

## 2020-04-13 HISTORY — PX: CARPAL TUNNEL RELEASE: SHX101

## 2020-04-13 SURGERY — RELEASE, CARPAL TUNNEL, ENDOSCOPIC
Anesthesia: General | Site: Wrist | Laterality: Right

## 2020-04-13 MED ORDER — DEXMEDETOMIDINE HCL IN NACL 200 MCG/50ML IV SOLN
INTRAVENOUS | Status: DC | PRN
  Administered 2020-04-13: 8 ug via INTRAVENOUS

## 2020-04-13 MED ORDER — MIDAZOLAM HCL 2 MG/2ML IJ SOLN
INTRAMUSCULAR | Status: AC
  Filled 2020-04-13: qty 2

## 2020-04-13 MED ORDER — PROPOFOL 10 MG/ML IV BOLUS
INTRAVENOUS | Status: DC | PRN
  Administered 2020-04-13: 100 mg via INTRAVENOUS
  Administered 2020-04-13: 200 mg via INTRAVENOUS

## 2020-04-13 MED ORDER — DROPERIDOL 2.5 MG/ML IJ SOLN
0.6250 mg | Freq: Once | INTRAMUSCULAR | Status: DC | PRN
  Filled 2020-04-13: qty 2

## 2020-04-13 MED ORDER — HYDROCODONE-ACETAMINOPHEN 5-325 MG PO TABS: 1.0000 | ORAL_TABLET | ORAL | Status: DC | PRN

## 2020-04-13 MED ORDER — LACTATED RINGERS IV SOLN: INTRAVENOUS | Status: DC

## 2020-04-13 MED ORDER — BUPIVACAINE HCL (PF) 0.5 % IJ SOLN
INTRAMUSCULAR | Status: AC
  Filled 2020-04-13: qty 30

## 2020-04-13 MED ORDER — OXYCODONE HCL 5 MG/5ML PO SOLN: 5.0000 mg | Freq: Once | ORAL | Status: DC | PRN

## 2020-04-13 MED ORDER — HYDROMORPHONE HCL 1 MG/ML IJ SOLN: 0.2500 mg | INTRAMUSCULAR | Status: DC | PRN

## 2020-04-13 MED ORDER — MEPERIDINE HCL 50 MG/ML IJ SOLN: 6.2500 mg | INTRAMUSCULAR | Status: DC | PRN

## 2020-04-13 MED ORDER — CHLORHEXIDINE GLUCONATE 0.12 % MT SOLN: 15.0000 mL | Freq: Once | OROMUCOSAL | Status: AC

## 2020-04-13 MED ORDER — FENTANYL CITRATE (PF) 100 MCG/2ML IJ SOLN
INTRAMUSCULAR | Status: DC | PRN
  Administered 2020-04-13 (×2): 50 ug via INTRAVENOUS

## 2020-04-13 MED ORDER — CEFAZOLIN SODIUM-DEXTROSE 2-4 GM/100ML-% IV SOLN
2.0000 g | INTRAVENOUS | Status: AC
  Administered 2020-04-13: 2 g via INTRAVENOUS

## 2020-04-13 MED ORDER — METOCLOPRAMIDE HCL 5 MG/ML IJ SOLN: 5.0000 mg | Freq: Three times a day (TID) | INTRAMUSCULAR | Status: DC | PRN

## 2020-04-13 MED ORDER — FENTANYL CITRATE (PF) 100 MCG/2ML IJ SOLN
INTRAMUSCULAR | Status: AC
  Filled 2020-04-13: qty 2

## 2020-04-13 MED ORDER — LIDOCAINE HCL (CARDIAC) PF 100 MG/5ML IV SOSY
PREFILLED_SYRINGE | INTRAVENOUS | Status: DC | PRN
  Administered 2020-04-13: 100 mg via INTRAVENOUS

## 2020-04-13 MED ORDER — BUPIVACAINE HCL (PF) 0.5 % IJ SOLN
INTRAMUSCULAR | Status: DC | PRN
  Administered 2020-04-13: 10 mL

## 2020-04-13 MED ORDER — FAMOTIDINE 20 MG PO TABS
20.0000 mg | ORAL_TABLET | Freq: Once | ORAL | Status: AC
  Administered 2020-04-13: 20 mg via ORAL

## 2020-04-13 MED ORDER — ORAL CARE MOUTH RINSE
15.0000 mL | Freq: Once | OROMUCOSAL | Status: AC
  Administered 2020-04-13: 15 mL via OROMUCOSAL

## 2020-04-13 MED ORDER — DEXAMETHASONE SODIUM PHOSPHATE 10 MG/ML IJ SOLN
INTRAMUSCULAR | Status: DC | PRN
  Administered 2020-04-13: 10 mg via INTRAVENOUS

## 2020-04-13 MED ORDER — PROPOFOL 10 MG/ML IV BOLUS
INTRAVENOUS | Status: AC
  Filled 2020-04-13: qty 20

## 2020-04-13 MED ORDER — ONDANSETRON HCL 4 MG/2ML IJ SOLN
INTRAMUSCULAR | Status: DC | PRN
  Administered 2020-04-13: 4 mg via INTRAVENOUS

## 2020-04-13 MED ORDER — POTASSIUM CHLORIDE IN NACL 20-0.9 MEQ/L-% IV SOLN
INTRAVENOUS | Status: DC
  Filled 2020-04-13 (×5): qty 1000

## 2020-04-13 MED ORDER — CHLORHEXIDINE GLUCONATE 0.12 % MT SOLN
OROMUCOSAL | Status: AC
  Filled 2020-04-13: qty 15

## 2020-04-13 MED ORDER — MIDAZOLAM HCL 2 MG/2ML IJ SOLN
INTRAMUSCULAR | Status: DC | PRN
  Administered 2020-04-13: 2 mg via INTRAVENOUS

## 2020-04-13 MED ORDER — LORAZEPAM 2 MG/ML IJ SOLN: 1.0000 mg | Freq: Once | INTRAMUSCULAR | Status: DC | PRN

## 2020-04-13 MED ORDER — METOCLOPRAMIDE HCL 10 MG PO TABS: 5.0000 mg | ORAL_TABLET | Freq: Three times a day (TID) | ORAL | Status: DC | PRN

## 2020-04-13 MED ORDER — ONDANSETRON HCL 4 MG PO TABS: 4.0000 mg | ORAL_TABLET | Freq: Four times a day (QID) | ORAL | Status: DC | PRN

## 2020-04-13 MED ORDER — FAMOTIDINE 20 MG PO TABS
ORAL_TABLET | ORAL | Status: AC
  Filled 2020-04-13: qty 1

## 2020-04-13 MED ORDER — OXYCODONE HCL 5 MG PO TABS: 5.0000 mg | ORAL_TABLET | Freq: Once | ORAL | Status: DC | PRN

## 2020-04-13 MED ORDER — PROMETHAZINE HCL 25 MG/ML IJ SOLN: 6.2500 mg | INTRAMUSCULAR | Status: DC | PRN

## 2020-04-13 MED ORDER — ONDANSETRON HCL 4 MG/2ML IJ SOLN: 4.0000 mg | Freq: Four times a day (QID) | INTRAMUSCULAR | Status: DC | PRN

## 2020-04-13 MED ORDER — CEFAZOLIN SODIUM-DEXTROSE 2-4 GM/100ML-% IV SOLN
INTRAVENOUS | Status: AC
  Filled 2020-04-13: qty 100

## 2020-04-13 SURGICAL SUPPLY — 35 items
APL PRP STRL LF DISP 70% ISPRP (MISCELLANEOUS) ×1
BNDG COHESIVE 4X5 TAN STRL (GAUZE/BANDAGES/DRESSINGS) ×2 IMPLANT
BNDG ELASTIC 2X5.8 VLCR STR LF (GAUZE/BANDAGES/DRESSINGS) ×2 IMPLANT
BNDG ESMARK 4X12 TAN STRL LF (GAUZE/BANDAGES/DRESSINGS) ×2 IMPLANT
CANISTER SUCT 1200ML W/VALVE (MISCELLANEOUS) ×2 IMPLANT
CHLORAPREP W/TINT 26 (MISCELLANEOUS) ×2 IMPLANT
CORD BIP STRL DISP 12FT (MISCELLANEOUS) ×2 IMPLANT
COVER WAND RF STERILE (DRAPES) ×2 IMPLANT
CUFF TOURN SGL QUICK 18X4 (TOURNIQUET CUFF) IMPLANT
CUFF TOURN SGL QUICK 24 (TOURNIQUET CUFF) ×2
CUFF TRNQT CYL 24X4X16.5-23 (TOURNIQUET CUFF) ×1 IMPLANT
DRAPE SURG 17X11 SM STRL (DRAPES) ×2 IMPLANT
FORCEPS JEWEL BIP 4-3/4 STR (INSTRUMENTS) ×2 IMPLANT
GAUZE SPONGE 4X4 12PLY STRL (GAUZE/BANDAGES/DRESSINGS) ×2 IMPLANT
GAUZE XEROFORM 1X8 LF (GAUZE/BANDAGES/DRESSINGS) ×2 IMPLANT
GLOVE INDICATOR 8.0 STRL GRN (GLOVE) ×2 IMPLANT
GLOVE SURG ENC MOIS LTX SZ8 (GLOVE) ×2 IMPLANT
GOWN STRL REUS W/ TWL LRG LVL3 (GOWN DISPOSABLE) ×1 IMPLANT
GOWN STRL REUS W/ TWL XL LVL3 (GOWN DISPOSABLE) ×1 IMPLANT
GOWN STRL REUS W/TWL LRG LVL3 (GOWN DISPOSABLE) ×2
GOWN STRL REUS W/TWL XL LVL3 (GOWN DISPOSABLE) ×2
KIT CARPAL TUNNEL (MISCELLANEOUS) ×2
KIT ESCP INSRT D SLOT CANN KN (MISCELLANEOUS) ×1 IMPLANT
KIT TURNOVER KIT A (KITS) ×2 IMPLANT
MANIFOLD NEPTUNE II (INSTRUMENTS) ×2 IMPLANT
NS IRRIG 500ML POUR BTL (IV SOLUTION) ×2 IMPLANT
PACK EXTREMITY ARMC (MISCELLANEOUS) ×2 IMPLANT
SPLINT WRIST LG LT TX990309 (SOFTGOODS) IMPLANT
SPLINT WRIST LG RT TX900304 (SOFTGOODS) IMPLANT
SPLINT WRIST M LT TX990308 (SOFTGOODS) IMPLANT
SPLINT WRIST M RT TX990303 (SOFTGOODS) IMPLANT
SPLINT WRIST XL LT TX990310 (SOFTGOODS) IMPLANT
SPLINT WRIST XL RT TX990305 (SOFTGOODS) ×2 IMPLANT
STOCKINETTE IMPERVIOUS 9X36 MD (GAUZE/BANDAGES/DRESSINGS) ×2 IMPLANT
SUT PROLENE 4 0 PS 2 18 (SUTURE) ×2 IMPLANT

## 2020-04-13 NOTE — Discharge Instructions (Addendum)
Endoscopic Carpal Tunnel Release, Care After This sheet gives you information about how to care for yourself after your procedure. Your health care provider may also give you more specific instructions. If you have problems or questions, contact your health care provider. What can I expect after the procedure? After the procedure, it is common to have:  Pain.  Swelling.  Wrist stiffness.  Bruising. Follow these instructions at home: Medicines  Take over-the-counter and prescription medicines only as told by your health care provider.  Ask your health care provider if the medicine prescribed to you: ? Requires you to avoid driving or using machinery. ? Can cause constipation. You may need to take these actions to prevent or treat constipation:  Drink enough fluid to keep your urine pale yellow.  Take over-the-counter or prescription medicines.  Eat foods that are high in fiber, such as beans, whole grains, and fresh fruits and vegetables.  Limit foods that are high in fat and processed sugars, such as fried or sweet foods. Bathing  Do not take baths, swim, or use a hot tub until your health care provider approves. Ask your health care provider if you may take showers.  Keep your bandage (dressing) dry until your health care provider says it can be removed. Cover it with a watertight covering when you take a bath or a shower. If you have a splint or brace:  Wear the splint or brace as told by your health care provider. Remove it only as told by your health care provider.  Loosen the splint or brace if your fingers tingle, become numb, or turn cold and blue.  Keep the splint or brace clean.  If the splint or brace is not waterproof: ? Do not let it get wet. ? Cover it with a watertight covering when you take a bath or a shower. Incision care  After the compression bandage has been removed, follow instructions from your health care provider about how to take care of your  incision. Make sure you: ? Wash your hands with soap and water for at least 20 seconds before and after you change your bandage (dressing). If soap and water are not available, use hand sanitizer. ? Change your dressing as told by your health care provider. ? Leave stitches (sutures), skin glue, or adhesive strips in place. These skin closures may need to stay in place for 2 weeks or longer. If adhesive strip edges start to loosen and curl up, you may trim the loose edges. Do not remove adhesive strips completely unless your health care provider tells you to do that.  Check your incision area every day for signs of infection. Check for: ? Redness. ? More swelling or pain. ? Fluid or blood. ? Warmth. ? Pus or a bad smell.   Managing pain, stiffness, and swelling  If directed, put ice on your wrist area. To do this: ? If you have a removable splint or brace, remove it as told by your health care provider. ? Put ice in a plastic bag. ? Place a towel between your skin and the bag or between your bandage and the bag. ? Leave the ice on for 20 minutes, 2-3 times a day. Do not fall asleep with the ice pack on your skin. ? Remove the ice if your skin turns bright red. This is very important. If you cannot feel pain, heat, or cold, you have a greater risk of damage to the area.  Move your fingers often to reduce  stiffness and swelling.  Raise your wrist above the level of your heart while you are sitting or lying down.   Activity  Do not drive until your health care provider approves.  Use your hand carefully. Do not do activities that cause pain. You should be able to do light activities with your hand.  Do not lift with your affected hand until your health care provider approves.  Avoid pulling and pushing with the injured arm.  Return to your normal activities as told by your health care provider. Ask your health care provider what activities are safe for you.  If physical therapy was  prescribed, do exercises as told by your therapist. Physical therapy can help you heal faster and regain movement. General instructions  Do not use any products that contain nicotine or tobacco, such as cigarettes, e-cigarettes, and chewing tobacco. These can delay incision healing after surgery. If you need help quitting, ask your health care provider.  Keep all follow-up visits. This is important. These include visits for physical therapy. Contact a health care provider if:  You have redness around your incision.  You have more swelling or pain.  You have fluid or blood coming from your incision.  Your incision feels warm to the touch.  You have pus or a bad smell coming from your incision.  You have a fever or chills.  You have pain that does not get better with medicine.  Your carpal tunnel symptoms do not go away after 2 months.  Your carpal tunnel symptoms go away and then come back. Get help right away if:  You have pain or numbness that is getting worse.  Your fingers or fingertips become very pale or bluish in color.  You are not able to move your fingers. Summary  After the procedure, it is common to have pain, swelling, stiffness, or bruising. Call your health care provider if you have pain that is not controlled with medicine.  If you have a splint or brace, wear it as told by your health care provider. Move your fingers often, and keep your hand above the level of your heart when resting.  Return to your normal activities as told by your health care provider. Ask your health care provider what activities are safe for you. You should be able to do light activities with your hand.  Contact a health care provider if you have numbness, weakness, a fever, or any signs of infection. This information is not intended to replace advice given to you by your health care provider. Make sure you discuss any questions you have with your health care provider. Document Revised:  06/17/2019 Document Reviewed: 06/17/2019 Elsevier Patient Education  2021 [employer]   Orthopedic discharge instructions: Keep dressing dry and intact. Keep hand elevated above heart level. May shower after dressing removed on postop day 4 (Monday). Cover sutures with Band-Aids after drying off, then reapply Velcro splint. Apply ice to affected area frequently. Take Mobic 15 mg daily OR ibuprofen 600-800 mg TID with meals for 7-10 days, then as necessary. Take ES Tylenol or pain medication as prescribed when needed.  Return for follow-up in 10-14 days or as scheduled.   AMBULATORY SURGERY  DISCHARGE INSTRUCTIONS   1) The drugs that you were given will stay in your system until tomorrow so for the next 24 hours you should not:  A) Drive an automobile B) Make any legal decisions C) Drink any alcoholic beverage   2) You may resume regular meals  tomorrow.  Today it is better to start with liquids and gradually work up to solid foods.  You may eat anything you prefer, but it is better to start with liquids, then soup and crackers, and gradually work up to solid foods.   3) Please notify your doctor immediately if you have any unusual bleeding, trouble breathing, redness and pain at the surgery site, drainage, fever, or pain not relieved by medication.    4) Additional Instructions:        Please contact your physician with any problems or Same Day Surgery at [phone], Monday through Friday 6 am to 4 pm, or [place] at [facility] number at [phone].

## 2020-04-13 NOTE — Transfer of Care (Signed)
Immediate Anesthesia Transfer of Care Note  Patient: Dominic Barton  Procedure(s) Performed: CARPAL TUNNEL RELEASE ENDOSCOPIC (Right Wrist)  Patient Location: PACU  Anesthesia Type:General  Level of Consciousness: awake, alert  and oriented  Airway & Oxygen Therapy: Patient Spontanous Breathing  Post-op Assessment: Report given to RN  Post vital signs: Reviewed and stable  Last Vitals:  Vitals Value Taken Time  BP 147/95 04/13/20 1649  Temp    Pulse 77 04/13/20 1653  Resp 29 04/13/20 1653  SpO2 93 % 04/13/20 1653  Vitals shown include unvalidated device data.  Last Pain:  Vitals:   04/13/20 1411  TempSrc: Temporal  PainSc: 0-No pain         Complications: No complications documented.

## 2020-04-13 NOTE — Anesthesia Postprocedure Evaluation (Signed)
Anesthesia Post Note  Patient: Dominic Barton  Procedure(s) Performed: CARPAL TUNNEL RELEASE ENDOSCOPIC (Right Wrist)  Patient location during evaluation: PACU Anesthesia Type: General Level of consciousness: awake and alert Pain management: pain level controlled Vital Signs Assessment: post-procedure vital signs reviewed and stable Respiratory status: spontaneous breathing, nonlabored ventilation, respiratory function stable and patient connected to nasal cannula oxygen Cardiovascular status: stable and blood pressure returned to baseline Postop Assessment: no apparent nausea or vomiting Anesthetic complications: no   No complications documented.   Last Vitals:  Vitals:   04/13/20 1730 04/13/20 1745  BP: (!) 146/94 (!) 150/98  Pulse: 85 74  Resp: (!) 21 (!) 29  Temp: 36.9 C 36.9 C  SpO2: 96% 97%    Last Pain:  Vitals:   04/13/20 1745  TempSrc:   PainSc: 0-No pain                 Corinda Gubler

## 2020-04-13 NOTE — H&P (Signed)
History of Present Illness:  Dominic Barton is a 50 y.o. male who who is here for history and physical for an upcoming right endoscopic carpal tunnel release. The patient is status post left endoscopic carpal tunnel release done on March 08, 2020 by Dr. Joice Lofts. The patient did very well after his left carpal tunnel release and is ready to do the right wrist. Patient has continued her right hand and wrist pain and paresthesias. The patient notes that the symptoms have been present for many years, but have worsened over the past few months. 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 He notes that his symptoms are aggravated by these repetitive activities, especially while at work. He notes that even smaller activities will aggravate his symptoms and contribute to numbness. The patient also notes occasional paresthesias at night which he attributes to positioning his arm while sleeping. He has been seen by myself for these symptoms who sent him for an EMG of both upper extremities and referred him to me for further evaluation and treatment. The patient denies any neck pain, but is a borderline diabetic.  Current Outpatient Medications: . ascorbic acid, vitamin C, (VITAMIN C) 1000 MG tablet Take by mouth  . losartan-hydrochlorothiazide (HYZAAR) 50-12.5 mg tablet Take 1 tablet by mouth once daily 90 tablet 1  . meloxicam (MOBIC) 15 MG tablet Take by mouth  . multivitamin tablet Take 1 tablet by mouth once daily  . multivitamin with minerals tablet Take 2 tablets by mouth once daily  . vit B1-B2-B3-B5-B6 (B-COMPLEX 100) 100-2-100-2-2 mg/mL injection Inject as directed every 14 (fourteen) days. Scheduled 03/02/20   Current Facility-Administered Medications:  . cyanocobalamin (VITAMIN B12) injection 1,000 mcg 1,000 mcg Intramuscular Q14 Days [name], MD 1,000 mcg at  03/16/20 1003   Allergies: No Known Allergies  Past Medical History:  . Carpal tunnel syndrome, right  . Chronic left shoulder pain 01/03/2020  . Essential hypertension 01/03/2020  . Excessive daytime sleepiness 01/03/2020  . Paresthesia 01/03/2020   Past Surgical History:  . Endoscopic left carpal tunnel release. Left 03/08/2020 (Dr. Joice Lofts)   Family History:  . No Known Problems Mother  . No Known Problems Father   Social History:   Socioeconomic History:  Marland Kitchen Marital status: Single  Spouse name: Not on file  . Number of children: 1  . Years of education: Not on file  . Highest education level: Not on file  Occupational History  . 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 Smoking status: Current Every Day Smoker  Packs/day: 1.50  Types: Cigarettes  . Smokeless tobacco: Former Clinical biochemist  . Vaping Use: Never used  Substance and Sexual Activity  . Alcohol use: Yes  Alcohol/week: 7.0 standard drinks  Types: 7 Cans of beer per week  . Drug use: Never  . Sexual activity: Yes  Other Topics Concern  . Not on file  Social History Narrative  . Not on file   Social Determinants of Health:   Financial Resource Strain: Not on file  Food Insecurity: Not on file  Transportation Needs: Not on file   Review of Systems:  A comprehensive 14 point ROS was performed, reviewed, and the pertinent orthopaedic findings are documented in the HPI.  Physical Exam: Vitals (03/30/20 0859):  Weight: (!) 109.3 kg (241 lb)  Height: 168.9 cm (  5' 6.5")  PainSc: 0-No pain   General/Constitutional: Pleasant husky middle-aged male in no acute distress. Neuro/Psych: Normal mood and affect, oriented to person, place and time. Eyes: Non-icteric. Pupils are equal, round, and reactive to light, and exhibit synchronous movement. ENT: Unremarkable. Lymphatic: No palpable adenopathy. Respiratory: Lungs clear to auscultation, Normal chest excursion, No  wheezes and Non-labored breathing Cardiovascular: Regular rate and rhythm with soft holosystolic murmur and No edema, swelling or tenderness, except as noted in detailed exam. Integumentary: No impressive skin lesions present, except as noted in detailed exam. Musculoskeletal: Unremarkable, except as noted in detailed exam.   Heart: Examination of the heart reveals regular, rate, and rhythm. There is no murmur noted on ascultation. There is a normal apical pulse.  Lungs: Lungs are clear to auscultation. There is no wheeze, rhonchi, or crackles. There is normal expansion of bilateral chest walls.   Right hand/wrist exam: Skin inspection of the right wrist and hand is unremarkable. No swelling, erythema, ecchymosis, abrasions, or other skin abnormalities are identified. He is able to tolerate wrist flexion to 55 degrees and extension to 60 degrees without any pain or catching. He is able active flex extend all digits fully without any pain or triggering. He is neurovascularly intact to all digits, other than slightly decreased sensation subjectively to the tips of his thumb, index, long, and ring fingers. He has a negative Phalen's test, but with a mildly positive Tinel's test at the wrist.  EMG results:  A recent EMG of both upper extremities is available for review. By report, the study demonstrates evidence of severe chronic bilateral carpal tunnel syndrome. These results were reviewed by myself and discussed with the patient.  Assessment: . Carpal tunnel syndrome, right.   Plan: The treatment options were discussed with the patient. In addition, patient educational materials were provided regarding the diagnosis and treatment options. The patient is quite frustrated by his symptoms and functional limitations, and is ready to consider more aggressive treatment options. Therefore, I have recommended a surgical procedure, specifically an endoscopic right carpal tunnel release. The procedure was  discussed with the patient, as were the potential risks (including bleeding, infection, nerve and/or blood vessel injury, persistent or recurrent pain/paresthesias, weakness of grip, need for further surgery, blood clots, strokes, heart attacks and/or arhythmias, pneumonia, etc.) and benefits. The patient states his/her understanding and wishes to proceed. All of the patient's questions and concerns were answered. He can call any time with further concerns. He will follow up post-surgery, routine.   H&P reviewed and patient re-examined. No changes.

## 2020-04-13 NOTE — Op Note (Signed)
04/13/2020  4:39 PM  Patient:   Dominic Barton  Pre-Op Diagnosis:   Right carpal tunnel syndrome.  Post-Op Diagnosis:   Same.  Procedure:   Endoscopic right carpal tunnel release.  Surgeon:   Maryagnes Amos, MD  Anesthesia:   General LMA  Findings:   As above.  Complications:   None  EBL:   0 cc  Fluids:   1000 cc crystalloid  TT:   19 minutes at 250 mmHg  Drains:   None  Closure:   4-0 Prolene interrupted sutures  Brief Clinical Note:   The patient is a 50 year old male with a history of progressive worsening pain and paresthesias to his right hand. His symptoms have progressed despite medications, activity modification, etc. His history and examination are consistent with right carpal tunnel syndrome, confirmed by EMG. The patient presents at this time for an endoscopic right carpal tunnel release.   Procedure:   The patient was brought into the operating room and lain in the supine position. After adequate general laryngeal mask anesthesia was obtained, the right hand and upper extremity were prepped with ChloraPrep solution before being draped sterilely. Preoperative antibiotics were administered. A timeout was performed to verify the appropriate surgical site before the limb was exsanguinated with an Esmarch and the tourniquet inflated to 250 mmHg. An approximately 1.5-2 cm incision was made over the volar wrist flexion crease, centered over the palmaris longus tendon. The incision was carried down through the subcutaneous tissues with care taken to identify and protect any neurovascular structures. The distal forearm fascia was penetrated just proximal to the transverse carpal ligament. The soft tissues were released off the superficial and deep surfaces of the distal forearm fascia and this was released proximally for 3-4 cm under direct visualization.  Attention was directed distally. The Therapist, nutritional was passed beneath the transverse carpal ligament along the ulnar  aspect of the carpal tunnel and used to release any adhesions as well as to remove any adherent synovial tissue before first the smaller then the larger of the two dilators were passed beneath the transverse carpal ligament along the ulnar margin of the carpal tunnel. The slotted cannula was introduced and the endoscope was placed into the slotted cannula and the undersurface of the transverse carpal ligament visualized. The distal margin of the transverse carpal ligament was marked by placing a 25-gauge needle percutaneously at Kaplan's cardinal point so that it entered the distal portion of the slotted cannula. Under endoscopic visualization, the transverse carpal ligament was released from proximal to distal using the end-cutting blade. A second pass was performed to ensure complete release of the ligament. The adequacy of release was verified both endoscopically and by palpation using the freer elevator.  The wound was irrigated thoroughly with sterile saline solution before being closed using 4-0 Prolene interrupted sutures. A total of 10 cc of 0.5% plain Sensorcaine was injected in and around the incision before a sterile bulky dressing was applied to the wound. The patient was placed into a volar wrist splint before being awakened, extubated, and returned to the recovery room in satisfactory condition after tolerating the procedure well.

## 2020-04-13 NOTE — Anesthesia Procedure Notes (Signed)
Procedure Name: LMA Insertion Date/Time: 04/13/2020 3:55 PM Performed by: Lanora Manis, CRNA Pre-anesthesia Checklist: Patient identified, Patient being monitored, Timeout performed, Emergency Drugs available and Suction available Patient Re-evaluated:Patient Re-evaluated prior to induction Oxygen Delivery Method: Circle system utilized Preoxygenation: Pre-oxygenation with 100% oxygen Induction Type: IV induction Ventilation: Mask ventilation without difficulty LMA: LMA inserted Tube type: Oral Tube size: 4.0 mm Number of attempts: 1 Placement Confirmation: positive ETCO2 and breath sounds checked- equal and bilateral Tube secured with: Tape Dental Injury: Teeth and Oropharynx as per pre-operative assessment

## 2020-04-13 NOTE — Anesthesia Preprocedure Evaluation (Signed)
Anesthesia Evaluation  Patient identified by MRN, date of birth, ID band Patient awake    Reviewed: Allergy & Precautions, H&P , NPO status , Patient's Chart, lab work & pertinent test results  Airway Mallampati: II       Dental no notable dental hx.    Pulmonary sleep apnea , Current Smoker and Patient abstained from smoking.,    Pulmonary exam normal breath sounds clear to auscultation       Cardiovascular hypertension, negative cardio ROS   Rhythm:Regular Rate:Normal     Neuro/Psych negative neurological ROS  negative psych ROS   GI/Hepatic negative GI ROS, Neg liver ROS,   Endo/Other  negative endocrine ROS  Renal/GU negative Renal ROS  negative genitourinary   Musculoskeletal negative musculoskeletal ROS (+)   Abdominal   Peds negative pediatric ROS (+)  Hematology negative hematology ROS (+)   Anesthesia Other Findings   Reproductive/Obstetrics negative OB ROS                             Anesthesia Physical Anesthesia Plan  ASA: II  Anesthesia Plan: General   Post-op Pain Management:    Induction: Intravenous  PONV Risk Score and Plan: 1 and Ondansetron and Dexamethasone  Airway Management Planned: LMA  Additional Equipment:   Intra-op Plan:   Post-operative Plan:   Informed Consent: I have reviewed the patients History and Physical, chart, labs and discussed the procedure including the risks, benefits and alternatives for the proposed anesthesia with the patient or authorized representative who has indicated his/her understanding and acceptance.     Dental advisory given  Plan Discussed with: CRNA, Anesthesiologist and Surgeon  Anesthesia Plan Comments:         Anesthesia Quick Evaluation

## 2020-04-14 ENCOUNTER — Encounter: Payer: Self-pay | Admitting: Surgery

## 2020-12-01 ENCOUNTER — Other Ambulatory Visit: Payer: Self-pay | Admitting: Surgery

## 2020-12-01 DIAGNOSIS — M25512 Pain in left shoulder: Secondary | ICD-10-CM

## 2020-12-01 DIAGNOSIS — M75122 Complete rotator cuff tear or rupture of left shoulder, not specified as traumatic: Secondary | ICD-10-CM

## 2020-12-01 DIAGNOSIS — M7582 Other shoulder lesions, left shoulder: Secondary | ICD-10-CM

## 2020-12-14 ENCOUNTER — Ambulatory Visit
Admission: RE | Admit: 2020-12-14 | Discharge: 2020-12-14 | Disposition: A | Discharge status: Home / Self Care | Payer: BC Managed Care – PPO | Source: Ambulatory Visit | Attending: Surgery | Admitting: Surgery

## 2020-12-14 ENCOUNTER — Other Ambulatory Visit: Payer: Self-pay

## 2020-12-14 DIAGNOSIS — M7582 Other shoulder lesions, left shoulder: Secondary | ICD-10-CM | POA: Insufficient documentation

## 2020-12-14 DIAGNOSIS — M75122 Complete rotator cuff tear or rupture of left shoulder, not specified as traumatic: Secondary | ICD-10-CM | POA: Diagnosis present

## 2020-12-14 DIAGNOSIS — M25512 Pain in left shoulder: Secondary | ICD-10-CM | POA: Insufficient documentation

## 2021-01-24 ENCOUNTER — Other Ambulatory Visit: Payer: Self-pay | Admitting: Surgery

## 2021-02-05 ENCOUNTER — Inpatient Hospital Stay: Admission: RE | Admit: 2021-02-05 | Payer: BC Managed Care – PPO | Source: Ambulatory Visit

## 2021-02-13 ENCOUNTER — Ambulatory Visit: Admit: 2021-02-13 | Payer: BC Managed Care – PPO | Admitting: Surgery

## 2021-02-13 SURGERY — SHOULDER ARTHROSCOPY WITH SUBACROMIAL DECOMPRESSION, ROTATOR CUFF REPAIR AND BICEP TENDON REPAIR
Anesthesia: Choice | Site: Shoulder | Laterality: Left

## 2023-06-14 IMAGING — MR MR SHOULDER*L* W/O CM
7 series · 36 of 40 positions shown · non-contrast
Comparison: None.

CLINICAL DATA: Patient complains of left shoulder that popped while
working out and has limited range of motion approximately 1 year
ago.

EXAM:
MRI OF THE LEFT SHOULDER WITHOUT CONTRAST
TECHNIQUE: Multiplanar, multisequence MR imaging of the shoulder was performed.
No intravenous contrast was administered.

[Series 5: T2 fat-sat · axial · left · 4.0mm · 0.50mm/px · z∈[-78,+42]mm · 7 of 26 slices shown (1 of 4)]
[im 1/26]
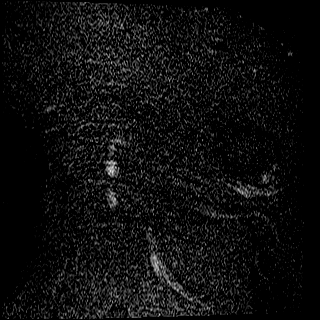
[im 5/26]
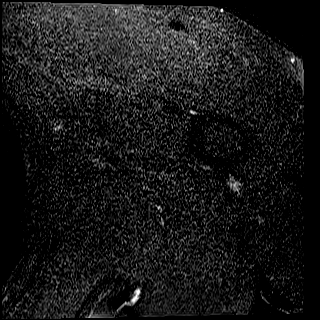
[im 9/26]
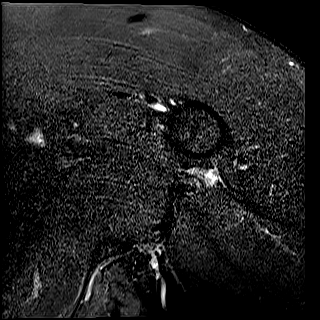
[im 13/26]
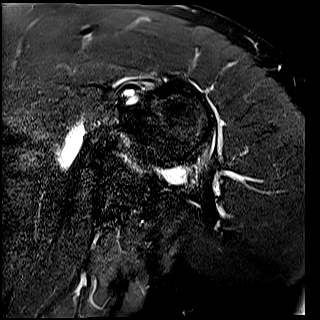
[im 17/26]
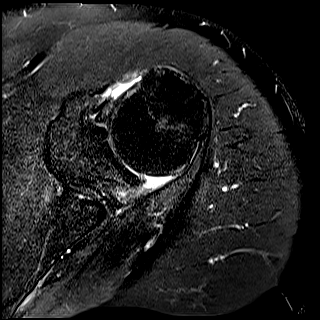
[im 21/26]
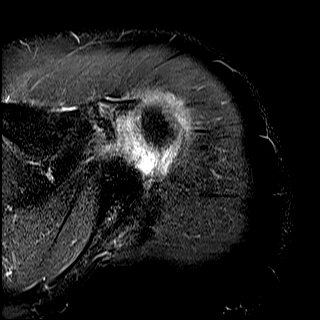
[im 26/26]
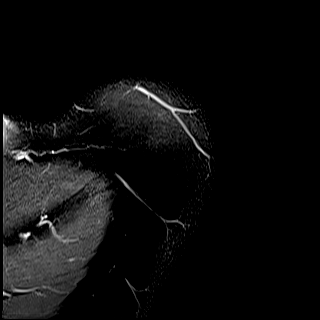

[Series 6: PD · oblique · left · 4.0mm · 0.44mm/px · 6 of 26 slices shown]
[im 1/26]
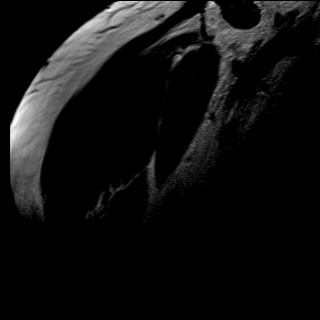
[im 6/26]
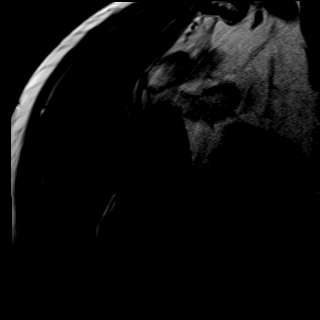
[im 11/26]
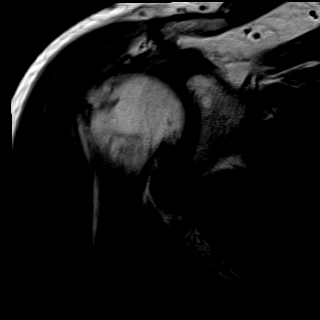
[im 16/26]
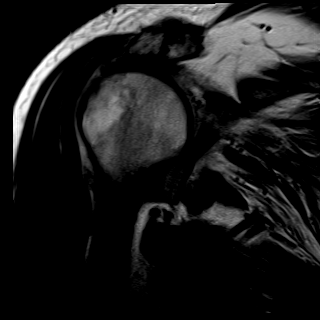
[im 21/26]
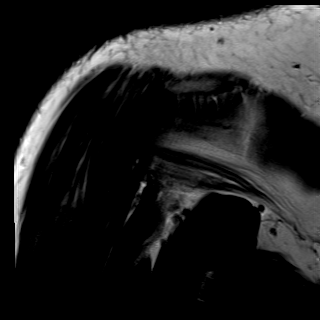
[im 26/26]
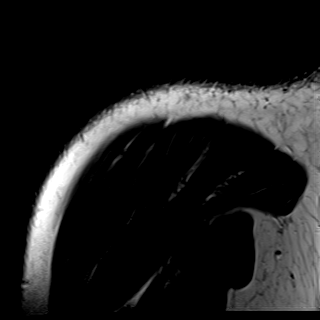

[Series 7: T2 fat-sat · oblique · left · 4.0mm · 0.44mm/px · 6 of 26 slices shown (2 of 4)]
[im 1/26]
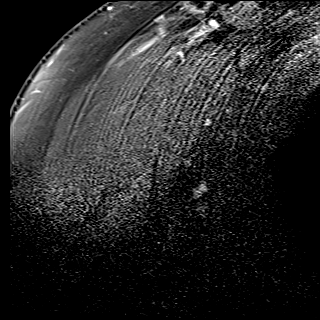
[im 6/26]
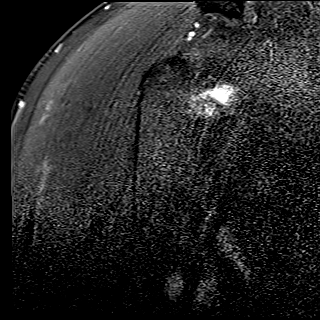
[im 11/26]
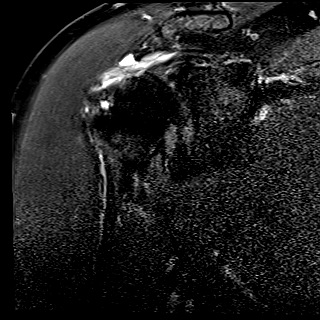
[im 16/26]
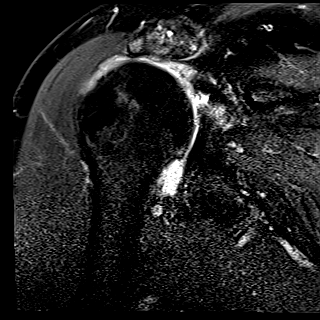
[im 21/26]
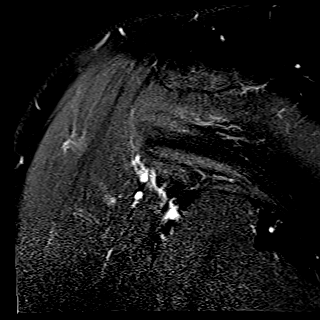
[im 26/26]
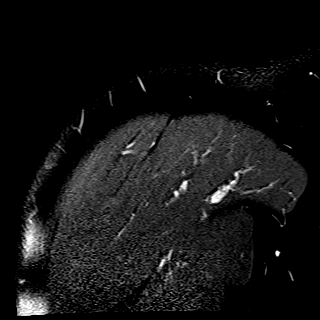

[Series 8: T2 fat-sat · coronal · left · 4.0mm · 0.22mm/px · 5 of 22 slices shown (3 of 4)]
[im 1/22]
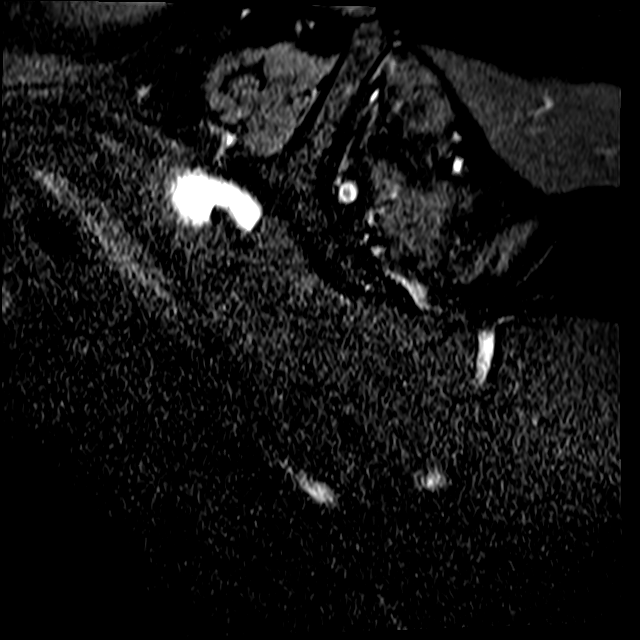
[im 6/22]
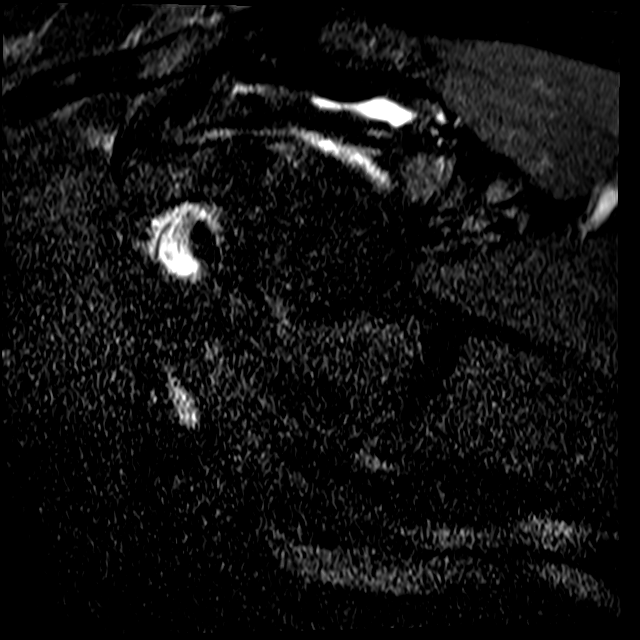
[im 11/22]
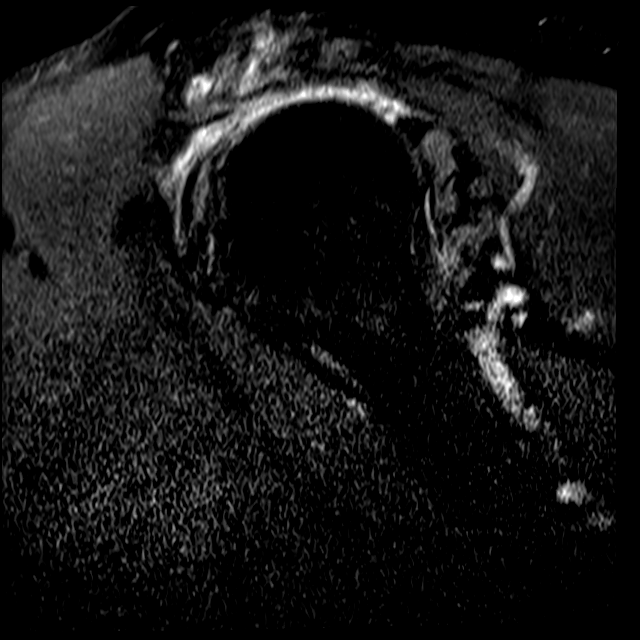
[im 16/22]
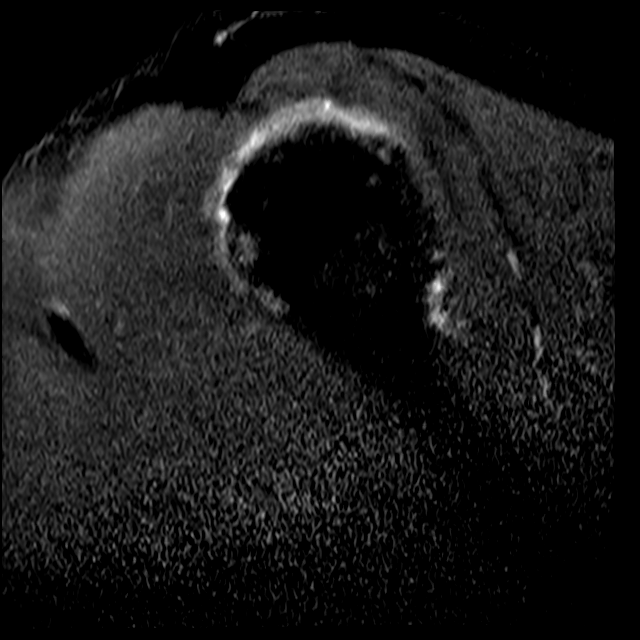
[im 22/22]
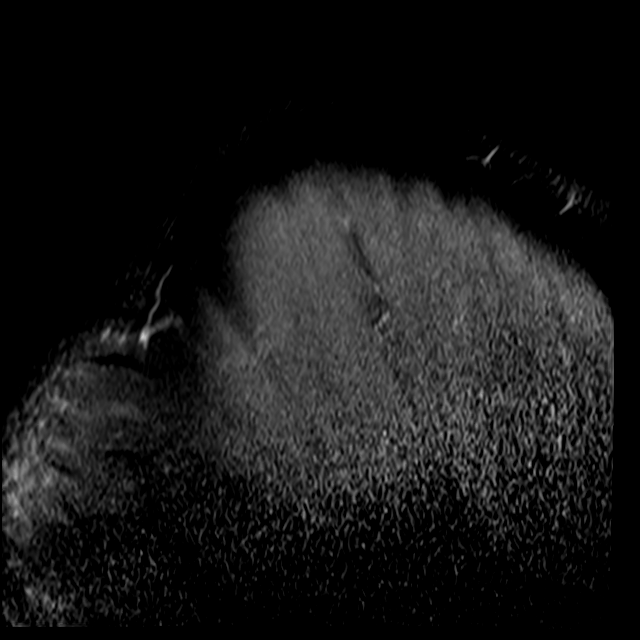

[Series 9: PD fat-sat · oblique · left · 4.0mm · 0.55mm/px · 6 of 25 slices shown]
[im 1/25]
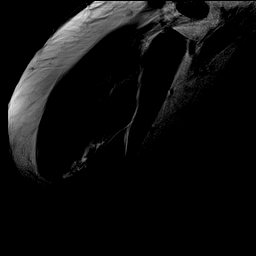
[im 5/25]
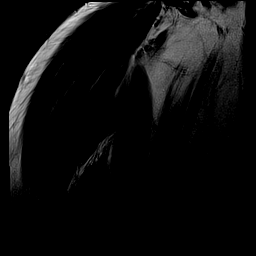
[im 10/25]
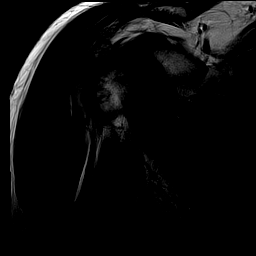
[im 15/25]
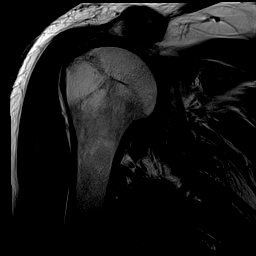
[im 20/25]
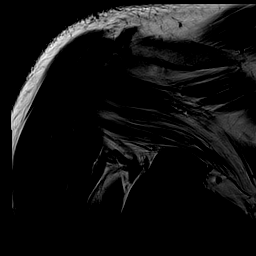
[im 25/25]
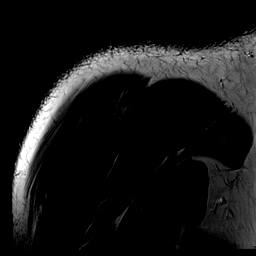

[Series 10: T1 · coronal · left · 4.0mm · 0.55mm/px · 1 of 22 slices shown]
[im 1/22]
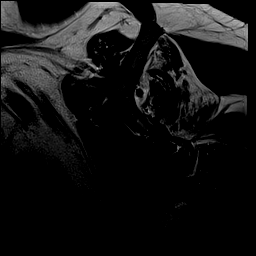

[Series 11: T2 fat-sat · coronal · left · 4.0mm · 0.55mm/px · 5 of 21 slices shown (4 of 4)]
[im 1/21]
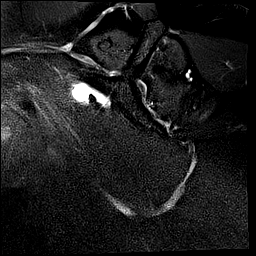
[im 6/21]
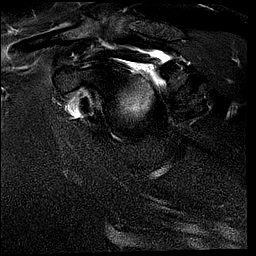
[im 11/21]
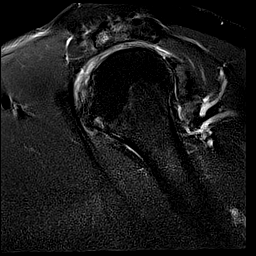
[im 16/21]
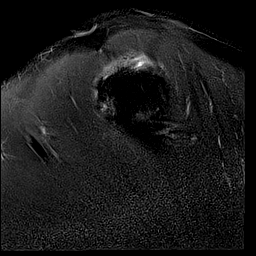
[im 21/21]
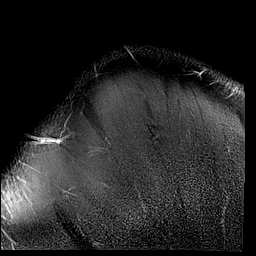

[36 of 40 positions shown; findings below may reference images not displayed]

FINDINGS: Rotator cuff: Complete tear of the supraspinatus tendon with 4.7 cm
of retraction. Moderate tendinosis of the infraspinatus tendon.
Teres minor tendon is intact. Mild tendinosis of the subscapularis
tendon with a partial-thickness tear superiorly.

Muscles: Mild-moderate atrophy of the infraspinatus muscle. Severe
atrophy of the teres minor muscle as can be seen with quadrilateral
space syndrome.

Biceps Long Head: Moderate tendinosis of the intra-articular portion
of the long head of the biceps tendon.

Acromioclavicular Joint: Moderate arthropathy of the
acromioclavicular joint. No subacromial/subdeltoid bursal fluid.

Glenohumeral Joint: No joint effusion. Partial-thickness cartilage
loss of the glenohumeral joint.

Labrum: Superior labral degeneration with a tear.

Bones: No fracture or dislocation. No aggressive osseous lesion.

Other: No fluid collection or hematoma.
IMPRESSION: 1. Complete tear of the supraspinatus tendon with 4.7 cm of
retraction.
2. Moderate tendinosis of the infraspinatus tendon.
3. Mild tendinosis of the subscapularis tendon with a
partial-thickness tear superiorly.
4. Moderate tendinosis of the intra-articular portion of the long
head of the biceps tendon.
5. Partial-thickness cartilage loss of the glenohumeral joint.

## 2023-09-19 NOTE — Progress Notes (Signed)
 Chief Complaint:   Chief Complaint  Patient presents with  . Follow-up    Patient presenting for follow-up of hyperlipidemia, hypertension, smoking and colon cancer screening.    Subjective:   Abundio Teuscher is a 53 y.o. male in today for follow up today  History of Present Illness Josejulian Tarango is a 53 year old male with hyperlipidemia who presents for medication management and follow-up.  Blood pressure is better controlled, diastolic numbers are still elevated.  On losartan  hydrochlorothiazide .  Noncompliant with his diet.  And continues to smoke as well.  He wants to quit but does not know where to start.  But he is not ready to quit yet.  He is missing to take the Zocor in the evenings.  He wants to see if this can be taken in the mornings.  He has a history of chronic sinus congestion and had a severe sinus infection approximately six months to a year ago, which was treated by an ENT with a nasal spray. The infection was severe enough to disrupt his sleep and breathing through his nose. Although the condition has improved, he continues to experience congestion, particularly when lying down, leading to coughing. He uses a nasal spray, which he finds effective, but is concerned about long-term use. He prefers a generic brand from [employer], which he feels works better for him.  Planes of fatigue during the daytime.  He does sleep well but feels tired in spite of his sleep.  He does snore at bedtime.  Does not remember if he wakes up for breathing.  Feels restless at times.  Never been tested for sleep apnea.     Current Outpatient Medications  Medication Sig Dispense Refill  . cyanocobalamin (VITAMIN B12) 1000 MCG tablet Take 1,000 mcg by mouth once daily    . fluticasone propionate (FLONASE) 50 mcg/actuation nasal spray Place 2 sprays into both nostrils once daily    . ibuprofen (MOTRIN) 200 MG tablet Take by mouth once as needed    . losartan -hydroCHLOROthiazide   (HYZAAR) 50-12.5 mg tablet Take 1 tablet by mouth once daily 90 tablet 3  . multivitamin tablet Take 1 tablet by mouth once daily    . simvastatin (ZOCOR) 40 MG tablet Take 1 tablet (40 mg total) by mouth at bedtime 90 tablet 1   No current facility-administered medications for this visit.    Allergies as of 09/19/2023  . (No Known Allergies)    Past Medical History:  Diagnosis Date  . Carpal tunnel syndrome, right   . Chronic left shoulder pain 01/03/2020  . Essential hypertension 01/03/2020  . Excessive daytime sleepiness 01/03/2020  . Paresthesia 01/03/2020    Past Surgical History:  Procedure Laterality Date  . Endoscopic left carpal tunnel release. Left 03/08/2020   Dr. Edie     Family History  Problem Relation Name Age of Onset  . No Known Problems Mother    . No Known Problems Father      Social History:  reports that he has been smoking cigarettes. He has quit using smokeless tobacco. He reports current alcohol use of about 7.0 standard drinks of alcohol per week. He reports that he does not use drugs.  Results for orders placed or performed in visit on 03/14/23  CBC w/auto Differential (5 Part)  Result Value Ref Range   WBC (White Blood Cell Count) 8.1 4.1 - 10.2 10^3/uL   RBC (Red Blood Cell Count) 4.75 4.69 - 6.13 10^6/uL   Hemoglobin 14.6  14.1 - 18.1 gm/dL   Hematocrit 56.6 59.9 - 52.0 %   MCV (Mean Corpuscular Volume) 91.2 80.0 - 100.0 fl   MCH (Mean Corpuscular Hemoglobin) 30.7 27.0 - 31.2 pg   MCHC (Mean Corpuscular Hemoglobin Concentration) 33.7 32.0 - 36.0 gm/dL   Platelet Count 710 849 - 450 10^3/uL   RDW-CV (Red Cell Distribution Width) 12.6 11.6 - 14.8 %   MPV (Mean Platelet Volume) 10.6 9.4 - 12.4 fl   Neutrophils 4.43 1.50 - 7.80 10^3/uL   Lymphocytes 2.73 1.00 - 3.60 10^3/uL   Monocytes 0.72 0.00 - 1.50 10^3/uL   Eosinophils 0.11 0.00 - 0.55 10^3/uL   Basophils 0.05 0.00 - 0.09 10^3/uL   Neutrophil % 54.8 32.0 - 70.0 %   Lymphocyte % 33.8 10.0  - 50.0 %   Monocyte % 8.9 4.0 - 13.0 %   Eosinophil % 1.4 1.0 - 5.0 %   Basophil% 0.6 0.0 - 2.0 %   Immature Granulocyte % 0.5 <=0.7 %   Immature Granulocyte Count 0.04 <=0.06 10^3/L  Comprehensive Metabolic Panel (CMP)  Result Value Ref Range   Glucose 145 (H) 70 - 110 mg/dL   Sodium 861 863 - 854 mmol/L   Potassium 4.7 3.6 - 5.1 mmol/L   Chloride 104 97 - 109 mmol/L   Carbon Dioxide (CO2) 27.2 22.0 - 32.0 mmol/L   Urea Nitrogen (BUN) 17 7 - 25 mg/dL   Creatinine 0.8 0.7 - 1.3 mg/dL   Glomerular Filtration Rate (eGFR) 108 >60 mL/min/1.73sq m   Calcium  9.4 8.7 - 10.3 mg/dL   AST  31 8 - 39 U/L   ALT  42 6 - 57 U/L   Alk Phos (alkaline Phosphatase) 54 34 - 104 U/L   Albumin 4.5 3.5 - 4.8 g/dL   Bilirubin, Total 0.4 0.3 - 1.2 mg/dL   Protein, Total 7.2 6.1 - 7.9 g/dL   A/G Ratio 1.7 1.0 - 5.0 gm/dL  Hemoglobin J8R  Result Value Ref Range   Hemoglobin A1C 6.1 (H) 4.2 - 5.6 %   Average Blood Glucose (Calc) 128 mg/dL   Narrative   Normal Range:    4.2 - 5.6% Increased Risk:  5.7 - 6.4% Diabetes:        >= 6.5% Glycemic Control for adults with diabetes:  <7%    Lipid Panel w/calc LDL  Result Value Ref Range   Cholesterol, Total 147 100 - 200 mg/dL   Triglyceride 848 35 - 199 mg/dL   HDL (High Density Lipoprotein) Cholesterol 43.2 29.0 - 71.0 mg/dL   LDL Calculated 74 0 - 130 mg/dL   VLDL Cholesterol 30 mg/dL   Cholesterol/HDL Ratio 3.4   Vitamin D, 25-Hydroxy - [facility]  Result Value Ref Range   Vitamin D, 25-Hydroxy - [facility] 37.4 30.0 - 100.0 ng/mL   Narrative   Performed at:  [address], [place], [place]  [id number] Lab Director: Frankey Sas MD, Phone:  [phone]  Urinalysis w/Microscopic  Result Value Ref Range   Color Yellow Colorless, Straw, Light Yellow, Yellow, Dark Yellow   Clarity Clear Clear   Specific Gravity 1.016 1.005 - 1.030   pH, Urine 6.0 5.0 - 8.0   Protein, Urinalysis Trace (!) Negative mg/dL   Glucose, Urinalysis  Negative Negative mg/dL   Ketones, Urinalysis Negative Negative mg/dL   Blood, Urinalysis Negative Negative   Nitrite, Urinalysis Negative Negative   Leukocyte Esterase, Urinalysis Negative Negative   Bilirubin, Urinalysis Negative Negative   Urobilinogen, Urinalysis 0.2  0.2 - 1.0 mg/dL   WBC, UA 0 <=5 /hpf   Red Blood Cells, Urinalysis 0 <=3 /hpf   Bacteria, Urinalysis 0-5 0 - 5 /hpf   Squamous Epithelial Cells, Urinalysis 0 /hpf  Thyroid Stimulating-Hormone (TSH)  Result Value Ref Range   Thyroid Stimulating Hormone (TSH) 2.109 0.450-5.330 uIU/ml uIU/mL  PSA, Total (Screen)  Result Value Ref Range   PSA (Prostate Specific Antigen), Total 0.89 0.10 - 4.00 ng/mL   Narrative   Test results were determined with Beckman Coulter Hybritech Assay. Values obtained with different assay methods cannot be used interchangeably in serial testing. Assay results should not be interpreted as absolute evidence of the presence or absence of malignant disease  Vitamin B12  Result Value Ref Range   Vitamin B12 534 >300 pg/mL   Narrative   <200 pg/mL:    Low, consistent with Vitamin B12 Deficiency 200-300 pg/mL: Borderline, possible Vitamin B12 Deficiency >300 pg/mL:    Normal.  Vitamin B12 Deficiency is unlikely       ROS:  General: No fever, chills or recent illness. No change in weight HEENT: No change in vision or hearing. No pain or difficulty with swallowing Respiratory: No cough or shortness of breath CV:  No chest pain or palpitations GI:  No pain, dyspepsia or change in bowel habits GU:  No dysuria, frequency, or hesitancy MSK:  No joint pain or injury Neurological: No headaches, changes in mental status, loss of sensation or strength  Psychological: No anxiety   or depression, complains about poor quality of sleep  Objective:   Body mass index is 38.06 kg/m.  BP (!) 132/90   Pulse 83   Ht 170.2 cm (5' 7)   Wt (!) 110.2 kg (243 lb)   SpO2 97%   BMI 38.06 kg/m   General:  WD/WN obese male, in no acute distress HEENT: Pupils equal and round, EOMI. oral mucosa moist.  Oropharynx clear. Neck: supple, trachea midline; no thyromegaly Respiratory:clear to auscultation.  No dullness to percussion.  No use of accessory muscles. Cardiac:  Regular rate and rhythm without murmur, gallops, or rubs Vascular: No carotid bruit and radials 2+; distal pulses 2+ Abdominal:soft, nontender, positive bowel sounds.  No organomegaly. Musculoskeletal:  No clubbing, cyanosis or edema.  Neuro: CN grossly intact.  No acute decrease in sensation in the upper and lower extremities bilaterally. Lymph: no cervical or supraclavicular lymphadenopathy   Assessment/Plan:   Hyperlipidemia, mixed  (primary encounter diagnosis) Essential hypertension B12 deficiency OSA (obstructive sleep apnea) Other fatigue Screening for prostate cancer  Assessment & Plan   1.  Hypertension- - Diastolic blood pressure is still elevated - Continue losartan  and hydrochlorothiazide  - Rule out and treat sleep apnea if present - Discussed dietary compliance and low-salt diet.  If not improving, will increase hydrochlorothiazide  dosage.  2. Suspected Obstructive Sleep Apnea -Symptoms of fatigue, snoring, poor quality of sleep. - Also has excessive daytime sleepiness. - Home sleep study has been ordered.  He is interested in being treated.  If possible wants to find alternatives for CPAP if positive.   3. Chronic nasal congestion - He experiences persistent nasal congestion, worsening when supine, leading to coughing.  - Seen ENT for this last year - Encouraged to take Flonase more regularly in the evenings to help with his nighttime symptoms. - Advised to keep the head of the bed elevated as well to reduce drainage   4. Cigarette smoking - He continues to smoke but is considering cessation due to  health concerns and cost. -  He experiences congestion and wheezing attributed to smoking. - We  discussed about prescription nicotine  patches, other medications that can help him with that.  He wants to think about it.  5.  Hyperlipidemia- - Switched from atorvastatin  to simvastatin due to cost issues. - He wants to see if he can be taking this medication in the morning as he tends to forget to take it at bedtime at all. - Encouraged to take it in the morning after a meal.  Monitor labs. - Diet and lifestyle changes encouraged as well.    Follow-up with me in 6 months for annual physical.  Sooner for acute issues     Goals     . * Blood Pressure < 140/90 (pt-stated)    . * Eat more fruits and vegetables (pt-stated)      Eat Better    .  Follow my doctor's care plan    . * Lose Weight (pt-stated)        LAVENIA BEAVER, MD  Portions of this note were created using dictation software and may contain typographical errors.  *Some images could not be shown.

## 2023-11-09 ENCOUNTER — Inpatient Hospital Stay

## 2023-11-09 ENCOUNTER — Inpatient Hospital Stay
Admission: EM | Admit: 2023-11-09 | Discharge: 2023-11-10 | DRG: 287 | Disposition: A | Discharge status: Home / Self Care | Attending: Internal Medicine | Admitting: Internal Medicine

## 2023-11-09 ENCOUNTER — Encounter
Admission: EM | Disposition: A | Discharge status: Home / Self Care | Payer: Self-pay | Source: Home / Self Care | Attending: Internal Medicine

## 2023-11-09 DIAGNOSIS — R7401 Elevation of levels of liver transaminase levels: Secondary | ICD-10-CM | POA: Diagnosis present

## 2023-11-09 DIAGNOSIS — R9431 Abnormal electrocardiogram [ECG] [EKG]: Secondary | ICD-10-CM | POA: Diagnosis not present

## 2023-11-09 DIAGNOSIS — R55 Syncope and collapse: Secondary | ICD-10-CM | POA: Diagnosis present

## 2023-11-09 DIAGNOSIS — Z79899 Other long term (current) drug therapy: Secondary | ICD-10-CM | POA: Diagnosis not present

## 2023-11-09 DIAGNOSIS — F17290 Nicotine dependence, other tobacco product, uncomplicated: Secondary | ICD-10-CM | POA: Diagnosis present

## 2023-11-09 DIAGNOSIS — E669 Obesity, unspecified: Secondary | ICD-10-CM | POA: Diagnosis present

## 2023-11-09 DIAGNOSIS — I350 Nonrheumatic aortic (valve) stenosis: Principal | ICD-10-CM | POA: Diagnosis present

## 2023-11-09 DIAGNOSIS — Z6839 Body mass index (BMI) 39.0-39.9, adult: Secondary | ICD-10-CM | POA: Diagnosis not present

## 2023-11-09 DIAGNOSIS — F17210 Nicotine dependence, cigarettes, uncomplicated: Secondary | ICD-10-CM | POA: Diagnosis present

## 2023-11-09 DIAGNOSIS — I213 ST elevation (STEMI) myocardial infarction of unspecified site: Principal | ICD-10-CM | POA: Diagnosis present

## 2023-11-09 DIAGNOSIS — E785 Hyperlipidemia, unspecified: Secondary | ICD-10-CM | POA: Diagnosis present

## 2023-11-09 DIAGNOSIS — R1310 Dysphagia, unspecified: Secondary | ICD-10-CM | POA: Diagnosis present

## 2023-11-09 DIAGNOSIS — R1319 Other dysphagia: Secondary | ICD-10-CM | POA: Insufficient documentation

## 2023-11-09 DIAGNOSIS — I10 Essential (primary) hypertension: Secondary | ICD-10-CM | POA: Diagnosis present

## 2023-11-09 DIAGNOSIS — I259 Chronic ischemic heart disease, unspecified: Secondary | ICD-10-CM

## 2023-11-09 DIAGNOSIS — G4733 Obstructive sleep apnea (adult) (pediatric): Secondary | ICD-10-CM | POA: Diagnosis present

## 2023-11-09 DIAGNOSIS — Z91048 Other nonmedicinal substance allergy status: Secondary | ICD-10-CM

## 2023-11-09 DIAGNOSIS — Z720 Tobacco use: Secondary | ICD-10-CM | POA: Insufficient documentation

## 2023-11-09 HISTORY — PX: LEFT HEART CATH AND CORONARY ANGIOGRAPHY: CATH118249

## 2023-11-09 LAB — COMPREHENSIVE METABOLIC PANEL WITH GFR
ALT: 52 U/L — ABNORMAL HIGH (ref 0–44)
AST: 50 U/L — ABNORMAL HIGH (ref 15–41)
Albumin: 4.2 g/dL (ref 3.5–5.0)
Alkaline Phosphatase: 44 U/L (ref 38–126)
Anion gap: 12 (ref 5–15)
BUN: 22 mg/dL — ABNORMAL HIGH (ref 6–20)
CO2: 22 mmol/L (ref 22–32)
Calcium: 9 mg/dL (ref 8.9–10.3)
Chloride: 107 mmol/L (ref 98–111)
Creatinine, Ser: 0.95 mg/dL (ref 0.61–1.24)
GFR, Estimated: >60 mL/min (ref >60)
Glucose, Bld: 137 mg/dL — ABNORMAL HIGH (ref 70–99)
Potassium: 3.9 mmol/L (ref 3.5–5.1)
Sodium: 141 mmol/L (ref 135–145)
Total Bilirubin: 0.6 mg/dL (ref 0.0–1.2)
Total Protein: 7.4 g/dL (ref 6.5–8.1)

## 2023-11-09 LAB — CBC
HCT: 41.2 % (ref 39.0–52.0)
Hemoglobin: 14 g/dL (ref 13.0–17.0)
MCH: 30.6 pg (ref 26.0–34.0)
MCHC: 34 g/dL (ref 30.0–36.0)
MCV: 90.2 fL (ref 80.0–100.0)
Platelets: 258 K/uL (ref 150–400)
RBC: 4.57 MIL/uL (ref 4.22–5.81)
RDW: 12.9 % (ref 11.5–15.5)
WBC: 15.1 K/uL — ABNORMAL HIGH (ref 4.0–10.5)
nRBC: 0 % (ref 0.0–0.2)

## 2023-11-09 LAB — LIPID PANEL
Cholesterol: 165 mg/dL (ref 0–200)
HDL: 38 mg/dL — ABNORMAL LOW (ref >40)
LDL Cholesterol: 93 mg/dL (ref 0–99)
Total CHOL/HDL Ratio: 4.3 ratio
Triglycerides: 172 mg/dL — ABNORMAL HIGH (ref <150)
VLDL: 34 mg/dL (ref 0–40)

## 2023-11-09 LAB — CBC WITH DIFFERENTIAL/PLATELET
Abs Immature Granulocytes: 0.06 K/uL (ref 0.00–0.07)
Basophils Absolute: 0.1 K/uL (ref 0.0–0.1)
Basophils Relative: 1 %
Eosinophils Absolute: 0.1 K/uL (ref 0.0–0.5)
Eosinophils Relative: 1 %
HCT: 42.2 % (ref 39.0–52.0)
Hemoglobin: 14.3 g/dL (ref 13.0–17.0)
Immature Granulocytes: 1 %
Lymphocytes Relative: 37 %
Lymphs Abs: 3.9 K/uL (ref 0.7–4.0)
MCH: 31 pg (ref 26.0–34.0)
MCHC: 33.9 g/dL (ref 30.0–36.0)
MCV: 91.5 fL (ref 80.0–100.0)
Monocytes Absolute: 1 K/uL (ref 0.1–1.0)
Monocytes Relative: 9 %
Neutro Abs: 5.4 K/uL (ref 1.7–7.7)
Neutrophils Relative %: 51 %
Platelets: 270 K/uL (ref 150–400)
RBC: 4.61 MIL/uL (ref 4.22–5.81)
RDW: 13 % (ref 11.5–15.5)
WBC: 10.5 K/uL (ref 4.0–10.5)
nRBC: 0 % (ref 0.0–0.2)

## 2023-11-09 LAB — TSH: TSH: 1.737 u[IU]/mL (ref 0.350–4.500)

## 2023-11-09 LAB — D-DIMER, QUANTITATIVE: D-Dimer, Quant: 0.53 ug{FEU}/mL — ABNORMAL HIGH (ref 0.00–0.50)

## 2023-11-09 LAB — TROPONIN I (HIGH SENSITIVITY)
Troponin I (High Sensitivity): 61 ng/L — ABNORMAL HIGH (ref <18)
Troponin I (High Sensitivity): 501 ng/L (ref <18)

## 2023-11-09 LAB — APTT: aPTT: 23 s — ABNORMAL LOW (ref 24–36)

## 2023-11-09 LAB — CREATININE, SERUM
Creatinine, Ser: 0.78 mg/dL (ref 0.61–1.24)
GFR, Estimated: >60 mL/min (ref >60)

## 2023-11-09 LAB — PROTIME-INR
INR: 1 (ref 0.8–1.2)
Prothrombin Time: 13.6 s (ref 11.4–15.2)

## 2023-11-09 LAB — HEMOGLOBIN A1C
Hgb A1c MFr Bld: 5.5 % (ref 4.8–5.6)
Mean Plasma Glucose: 111.15 mg/dL

## 2023-11-09 LAB — GAMMA GT: GGT: 50 U/L (ref 7–50)

## 2023-11-09 LAB — MRSA NEXT GEN BY PCR, NASAL: MRSA by PCR Next Gen: NOT DETECTED

## 2023-11-09 LAB — LACTIC ACID, PLASMA: Lactic Acid, Venous: 1.9 mmol/L (ref 0.5–1.9)

## 2023-11-09 LAB — GLUCOSE, CAPILLARY: Glucose-Capillary: 147 mg/dL — ABNORMAL HIGH (ref 70–99)

## 2023-11-09 SURGERY — LEFT HEART CATH AND CORONARY ANGIOGRAPHY
Anesthesia: Moderate Sedation

## 2023-11-09 MED ORDER — HEPARIN (PORCINE) IN NACL 1000-0.9 UT/500ML-% IV SOLN
INTRAVENOUS | Status: DC | PRN
  Administered 2023-11-09: 1000 mL

## 2023-11-09 MED ORDER — VITAMIN B-12 1000 MCG PO TABS
2000.0000 ug | ORAL_TABLET | Freq: Every day | ORAL | Status: DC
  Administered 2023-11-10: 2000 ug via ORAL
  Filled 2023-11-09: qty 2

## 2023-11-09 MED ORDER — POLYETHYLENE GLYCOL 3350 17 G PO PACK: 17.0000 g | PACK | Freq: Every day | ORAL | Status: DC | PRN

## 2023-11-09 MED ORDER — IOHEXOL 300 MG/ML  SOLN
INTRAMUSCULAR | Status: DC | PRN
  Administered 2023-11-09: 135 mL

## 2023-11-09 MED ORDER — LOSARTAN POTASSIUM-HCTZ 50-12.5 MG PO TABS: 1.0000 | ORAL_TABLET | Freq: Every day | ORAL | Status: DC

## 2023-11-09 MED ORDER — MIDAZOLAM HCL 2 MG/2ML IJ SOLN
INTRAMUSCULAR | Status: DC | PRN
  Administered 2023-11-09: 1 mg via INTRAVENOUS

## 2023-11-09 MED ORDER — MIDAZOLAM HCL 2 MG/2ML IJ SOLN
INTRAMUSCULAR | Status: AC
  Filled 2023-11-09: qty 2

## 2023-11-09 MED ORDER — IOHEXOL 350 MG/ML SOLN
75.0000 mL | Freq: Once | INTRAVENOUS | Status: AC | PRN
  Administered 2023-11-09: 75 mL via INTRAVENOUS

## 2023-11-09 MED ORDER — LOSARTAN POTASSIUM 50 MG PO TABS
50.0000 mg | ORAL_TABLET | Freq: Every day | ORAL | Status: DC
  Administered 2023-11-09 – 2023-11-10 (×2): 50 mg via ORAL
  Filled 2023-11-09 (×2): qty 1

## 2023-11-09 MED ORDER — FENTANYL CITRATE (PF) 100 MCG/2ML IJ SOLN
INTRAMUSCULAR | Status: DC | PRN
  Administered 2023-11-09: 25 ug via INTRAVENOUS

## 2023-11-09 MED ORDER — SODIUM CHLORIDE 0.9% FLUSH: 3.0000 mL | INTRAVENOUS | Status: DC | PRN

## 2023-11-09 MED ORDER — ENOXAPARIN SODIUM 60 MG/0.6ML IJ SOSY
60.0000 mg | PREFILLED_SYRINGE | INTRAMUSCULAR | Status: DC
  Administered 2023-11-09: 60 mg via SUBCUTANEOUS
  Filled 2023-11-09: qty 0.6

## 2023-11-09 MED ORDER — LIDOCAINE HCL (PF) 1 % IJ SOLN
INTRAMUSCULAR | Status: AC
Start: 2023-11-09 — End: 2023-11-09
  Filled 2023-11-09: qty 30

## 2023-11-09 MED ORDER — HEPARIN SODIUM (PORCINE) 1000 UNIT/ML IJ SOLN
INTRAMUSCULAR | Status: DC | PRN
  Administered 2023-11-09: 4000 [IU] via INTRAVENOUS

## 2023-11-09 MED ORDER — NICOTINE 14 MG/24HR TD PT24
14.0000 mg | MEDICATED_PATCH | Freq: Every day | TRANSDERMAL | Status: DC
Start: 2023-11-09 — End: 2023-11-10
  Filled 2023-11-09 (×2): qty 1

## 2023-11-09 MED ORDER — FENTANYL CITRATE (PF) 100 MCG/2ML IJ SOLN
INTRAMUSCULAR | Status: AC
  Filled 2023-11-09: qty 2

## 2023-11-09 MED ORDER — SODIUM CHLORIDE 0.9 % IV SOLN: 250.0000 mL | INTRAVENOUS | Status: AC | PRN

## 2023-11-09 MED ORDER — ATORVASTATIN CALCIUM 20 MG PO TABS
40.0000 mg | ORAL_TABLET | Freq: Every day | ORAL | Status: DC
  Administered 2023-11-09 – 2023-11-10 (×2): 40 mg via ORAL
  Filled 2023-11-09 (×2): qty 2

## 2023-11-09 MED ORDER — VITAMIN B-12 ER 1500 MCG PO TBCR: 1500.0000 ug | EXTENDED_RELEASE_TABLET | Freq: Every day | ORAL | Status: DC

## 2023-11-09 MED ORDER — ONDANSETRON HCL 4 MG PO TABS: 4.0000 mg | ORAL_TABLET | Freq: Four times a day (QID) | ORAL | Status: DC | PRN

## 2023-11-09 MED ORDER — SODIUM CHLORIDE 0.9% FLUSH
3.0000 mL | Freq: Two times a day (BID) | INTRAVENOUS | Status: DC
Start: 2023-11-09 — End: 2023-11-10
  Administered 2023-11-10: 3 mL via INTRAVENOUS

## 2023-11-09 MED ORDER — CHLORHEXIDINE GLUCONATE CLOTH 2 % EX PADS: 6.0000 | MEDICATED_PAD | Freq: Every day | CUTANEOUS | Status: DC

## 2023-11-09 MED ORDER — HYDROCHLOROTHIAZIDE 12.5 MG PO TABS
12.5000 mg | ORAL_TABLET | Freq: Every day | ORAL | Status: DC
  Administered 2023-11-09 – 2023-11-10 (×2): 12.5 mg via ORAL
  Filled 2023-11-09 (×2): qty 1

## 2023-11-09 MED ORDER — ONDANSETRON HCL 4 MG/2ML IJ SOLN: 4.0000 mg | Freq: Four times a day (QID) | INTRAMUSCULAR | Status: DC | PRN

## 2023-11-09 MED ORDER — VERAPAMIL HCL 2.5 MG/ML IV SOLN
INTRAVENOUS | Status: DC | PRN
  Administered 2023-11-09: 2.5 mg via INTRA_ARTERIAL

## 2023-11-09 MED ORDER — FREE WATER: 500.0000 mL | Freq: Once | Status: DC

## 2023-11-09 MED ORDER — ACETAMINOPHEN 325 MG PO TABS: 650.0000 mg | ORAL_TABLET | Freq: Four times a day (QID) | ORAL | Status: DC | PRN

## 2023-11-09 MED ORDER — LIDOCAINE HCL (PF) 1 % IJ SOLN
INTRAMUSCULAR | Status: DC | PRN
  Administered 2023-11-09: 2 mL

## 2023-11-09 MED ORDER — ACETAMINOPHEN 650 MG RE SUPP: 650.0000 mg | Freq: Four times a day (QID) | RECTAL | Status: DC | PRN

## 2023-11-09 MED ORDER — VERAPAMIL HCL 2.5 MG/ML IV SOLN
INTRAVENOUS | Status: AC
  Filled 2023-11-09: qty 2

## 2023-11-09 MED ORDER — HEPARIN SODIUM (PORCINE) 1000 UNIT/ML IJ SOLN
INTRAMUSCULAR | Status: AC
  Filled 2023-11-09: qty 10

## 2023-11-09 SURGICAL SUPPLY — 11 items
CATH 5FR JL3.5 JR4 ANG PIG MP (CATHETERS) IMPLANT
DEVICE RAD TR BAND REGULAR (VASCULAR PRODUCTS) IMPLANT
DRAPE BRACHIAL (DRAPES) IMPLANT
GLIDESHEATH SLEND SS 6F .021 (SHEATH) IMPLANT
GUIDEWIRE INQWIRE 1.5J.035X260 (WIRE) IMPLANT
KIT ENCORE 26 ADVANTAGE (KITS) IMPLANT
PACK CARDIAC CATH (CUSTOM PROCEDURE TRAY) ×1 IMPLANT
SET ATX-X65L (MISCELLANEOUS) IMPLANT
SHEATH 6FR 75 DEST SLENDER (SHEATH) IMPLANT
STATION PROTECTION PRESSURIZED (MISCELLANEOUS) IMPLANT
TUBING CIL FLEX 10 FLL-RA (TUBING) IMPLANT

## 2023-11-09 NOTE — ED Triage Notes (Signed)
 [place] Sharp pain left side of chest, passed out. Brief moment of CPR from witnesses. Chest pressure x 1 week. No cardiac history. Ems report cool and diaphoretic on arrival  Hx htn and high cholesterol Cbg 137 4 baby aspirin 127/75 18 right hand

## 2023-11-09 NOTE — Assessment & Plan Note (Signed)
 Hypertension Continue losartan  50 mg daily and hydrochlorothiazide  12.5 mg daily

## 2023-11-09 NOTE — Assessment & Plan Note (Signed)
 Hyperlipidemia Starting high intensity statin atorvastatin  40 mg daily

## 2023-11-09 NOTE — Progress Notes (Signed)
   11/09/23 1615  Spiritual Encounters  Type of Visit Initial  Care provided to: [facility] partners present during encounter Nurse  Referral source Code page  Reason for visit Code  OnCall Visit Yes   Chaplain responded to a Code STEMI page.  Chaplain offered spiritual care to the patient's family and assisted various family members with getting around in the ED and Cath Lab.  Chaplain prayed with patient's mother and provided reflective listening about family dynamics.  Chaplain shared relevant information with the Cath Lab HUB and let them know family was in the waiting room.  Chaplain remains available as requested by family and/or staff.    Rev. Rana M. Nicholaus, M.Div. Chaplain Resident [facility]

## 2023-11-09 NOTE — Assessment & Plan Note (Signed)
 Syncope, loss of consciousness Patient had loss of [place] Of note there is a harsh systolic murmur on examination Coronary catheterization did not show significant disease as per verbal report, no evidence of dissection Differential diagnosis would be a transient arrhythmia, structural heart disease or critical valvular disease, vasospasm or myocarditis, no evidence to point to a neurological origin, obtaining CT pulmonary angiogram to rule out hemodynamically significant PE Initial troponin is 61 with serial measurements pending, initial D-dimer 0.53 Plan: Echocardiogram, CT pulmonary angiogram to rule out PE, Lipo A, lipid panel, A1c, TSH, lactic acid, cardiology consultation for ongoing review, continuous telemetry to capture malignant arrhythmias, serial examination of access radial site as per protocol

## 2023-11-09 NOTE — ED Triage Notes (Signed)
 To to cath lab at this time.

## 2023-11-09 NOTE — Progress Notes (Signed)
 Pt received from cath lab in NAD. Right wrist TR band in place without bleeding or hematoma. Small amount of old blood on band. Pt denies CP or SOB. Attached to cardiac monitoring. NS infusing at 20 cc per hour. Pt awake, alert, and oriented. Defib/pacing pads in place. Pt transported to and from CT scan without incident. Visitors at bedside.

## 2023-11-09 NOTE — ED Provider Notes (Signed)
 [facility] Provider Note    Event Date/Time   First MD Initiated Contact with Patient 11/09/23 1644     (approximate)   History   Code STEMI   HPI  Dominic Barton is a 53 y.o. male who presents to the ED for evaluation of Code STEMI   I reviewed PCP visit from July.  Obese patient with history of HTN, HLD and smoking.  [place] for evaluation of chest discomfort, syncopal episode and field activation of code STEMI.  Reportedly feeling some chest discomfort over the past 1 week.  [place] had a sharp episode of chest pain, passed out.  Bystanders did very brief CPR, uncertain if he actually lost pulses.  EMS found him with complaints of dizziness, hemodynamically stable, EKG transmitted to us  with ischemic changes and activated field code STEMI.  Here in the ED he is reporting dizziness, describes some improved chest pressure   Physical Exam   Triage Vital Signs: ED Triage Vitals  Encounter Vitals Group     BP 11/09/23 1642 129/82     Girls Systolic BP Percentile --      Girls Diastolic BP Percentile --      Boys Systolic BP Percentile --      Boys Diastolic BP Percentile --      Pulse Rate 11/09/23 1641 85     Resp 11/09/23 1641 (!) 31     Temp --      Temp src --      SpO2 11/09/23 1641 97 %     Weight --      Height --      Head Circumference --      Peak Flow --      Pain Score --      Pain Loc --      Pain Education --      Exclude from Growth Chart --     Most recent vital signs: Vitals:   11/09/23 1641 11/09/23 1642  BP:  129/82  Pulse: 85 95  Resp: (!) 31 (!) 21  SpO2: 97% 100%    General: Awake, no distress.  Obese CV:  Good peripheral perfusion.  Resp:  Normal effort.  Abd:  No distention.  MSK:  No deformity noted.  Neuro:  No focal deficits appreciated. Other:     ED Results / Procedures / Treatments   Labs (all labs ordered are  listed, but only abnormal results are displayed) Labs Reviewed  CBC WITH DIFFERENTIAL/PLATELET  HEMOGLOBIN A1C  PROTIME-INR  APTT  COMPREHENSIVE METABOLIC PANEL WITH GFR  LIPID PANEL  LACTIC ACID, PLASMA  TROPONIN I (HIGH SENSITIVITY)    EKG Sinus rhythm the rate of 84 bpm, ischemic changes concerning for STEMI with elevations to aVR, V1 with depressions to 2, 3 and aVF.  T wave inversions laterally.  RADIOLOGY   Official radiology report(s): No results found.  PROCEDURES and INTERVENTIONS:  .1-3 Lead EKG Interpretation  Performed by: Claudene Rover, MD Authorized by: Claudene Rover, MD     Interpretation: normal     ECG rate:  90   ECG rate assessment: normal     Rhythm: sinus rhythm     Ectopy: none     Conduction: normal   .Critical Care  Performed by: Claudene Rover, MD Authorized by: Claudene Rover, MD   Critical care provider statement:    Critical care time (  minutes):  30   Critical care time was exclusive of:  Separately billable procedures and treating other patients   Critical care was necessary to treat or prevent imminent or life-threatening deterioration of the following conditions:  Cardiac failure and circulatory failure   Critical care was time spent personally by me on the following activities:  Development of treatment plan with patient or surrogate, discussions with consultants, evaluation of patient's response to treatment, examination of patient, ordering and review of laboratory studies, ordering and review of radiographic studies, ordering and performing treatments and interventions, pulse oximetry, re-evaluation of patient's condition and review of old charts   Medications - No data to display   IMPRESSION / MDM / ASSESSMENT AND PLAN / ED COURSE  I reviewed the triage vital signs and the nursing notes.  Differential diagnosis includes, but is not limited to, ACS, PTX, PNA, muscle strain/spasm, PE, dissection, anxiety, pleural effusion  {Patient  presents with symptoms of an acute illness or injury that is potentially life-threatening.  Patient presents as a field code STEMI, emergently taken to the Cath Lab.  Presents to the ED hemodynamically stable with a normal sinus rhythm.  Twelve-lead with ischemic changes concerning for STEMI.  Only complaints of dizziness.  Received prehospital aspirin.  Cardiology at the bedside throughout his time in the ED.  No dysrhythmias.  Emergently taken to left heart cath.  Clinical Course as of 11/09/23 1656  Sun Nov 09, 2023  1645 Headed up to cath lab [DS]  1653 In our family wait room I meet patient's mother, uncle, daughter and described presentation, signs of heart attack, patient currently in the Cath Lab.  I answered their questions to the best of my ability.  Chaplain will walk them up to the Cath Lab wait area [DS]    Clinical Course User Index [DS] Claudene Rover, MD     FINAL CLINICAL IMPRESSION(S) / ED DIAGNOSES   Final diagnoses:  ST elevation myocardial infarction (STEMI), unspecified artery (HCC)  Chest pain due to myocardial ischemia, unspecified ischemic chest pain type  Syncope and collapse     Rx / DC Orders   ED Discharge Orders     None        Note:  This document was prepared using Dragon voice recognition software and may include unintentional dictation errors.   Claudene Rover, MD 11/09/23 [phone]

## 2023-11-09 NOTE — Assessment & Plan Note (Signed)
 Intermittent dysphagia Outpatient follow-up

## 2023-11-09 NOTE — Consult Note (Signed)
 PHARMACIST - PHYSICIAN COMMUNICATION  CONCERNING:  Enoxaparin  (Lovenox ) for DVT Prophylaxis   ASSESSMENT: Patient was prescribed enoxaparin  40 mg subcutaneously every 24 hours for VTE prophylaxis.   Body mass index is 39.02 kg/m.  Estimated Creatinine Clearance: 110.5 mL/min (by C-G formula based on SCr of 0.95 mg/dL).  Based on [facility] policy, patient qualifies for enoxaparin  dosing of 0.5 mg per kilogram of total body weight every 24 hours because their body mass index is >30 kg/m2.  PLAN: Pharmacy has adjusted enoxaparin  dose per [facility] policy.  Description: Patient is now receiving enoxaparin  60 mg subcutaneously every 24 hours.  Will M. Lenon, PharmD, BCPS Clinical Pharmacist 11/09/2023 6:12 PM

## 2023-11-09 NOTE — H&P (Addendum)
 History and Physical    Patient: Dominic Barton FMW:[id number] DOB: April 23, 1970 DOA: 11/09/2023 DOS: the patient was seen and examined on 11/09/2023 PCP: Dominic Bail, MD  [place]  Chief Complaint:  Chief Complaint  Patient presents with   Code STEMI   HPI:  Mr. Dominic Barton is a 53 year old gentleman with a past medical history of obesity, hypertension, hyperlipidemia, smoking who was [place] today and after hitting his first T felt that he was feeling off some left-sided chest pain and then lost consciousness.  Reportedly a bystander performed CPR although not completely clear if the patient went into full cardiac arrest.  He reports he is at his baseline state of health without any palpitations orthopnea or previous episodes like this over the last week.  The patient was reviewed in the emergency department and was treated as a code STEMI going straight to the catheterization lab. No loss of bladder or bowel control or neurological symptoms.  The patient's wife was at the bedside and reports he had had almost your long intermittent dysphagia.  There has not been an exacerbation of this in recent times.  There is no report of choking at the time of the event.  12-lead ECG independently reviewed and interpreted patient has a ventricular rate of 84 PR interval 188 QTc 456 with ST elevations in V1, AVR and ST depressions in lead II and aVF concerning for ischemic changes, official cath report is not available however case discussed with Dr. Florencio who reports coronaries were clear and no evidence of aortic dissection he specifically shot contrast for this indication.  Past medical records reviewed and summarized: Primary care visit on 09/19/2023: Patient had hyperlipidemia and hypertension  Review of Systems:  Constitutional: Denies Weight Loss, Fever, Chills or Night Sweats Eyes: Denies Blurry Vision, Eye Pain or Decreased Vision ENT: Denies Sore  Throat , Tinnitus, Hearing Loss Respiratory: Denies Shortness of Breath, Cough, Hemoptysis, Wheezing, Pleurisy Cardiovascular: Reports Chest Pain and no Paroxsymal Nocturnal Dyspnea, Palpitations, Edema Gastrointestinal: Denies Nausea, Vomiting, Diarrhea, Hematemesis, Melena Genitourinary: Denies hematuria, dysuria, hesitancy, incontinence Musculoskeletal: Denies Arthralgias, Myalgias, Muscle Weakness, Joint Swelling Allergy/Immun: Denies history of Hay Fever, Positive PPD or Hives All other systems were reviewed and are negative Past Medical History:  Diagnosis Date   Anemia 01/2020   bitamin b12 deficiency   Bilateral hand numbness    Excessive daytime sleepiness    Hypertension    Sleep apnea 03/02/2020   in the process of being worked up for sleep apnea   Tendonitis of shoulder, left 2021   Past Surgical History:  Procedure Laterality Date   CARPAL TUNNEL RELEASE Left 03/08/2020   Procedure: CARPAL TUNNEL RELEASE ENDOSCOPIC;  Surgeon: Dominic Norleen PARAS, MD;  Location: [facility] ORS;  Service: Orthopedics;  Laterality: Left;   CARPAL TUNNEL RELEASE Right 04/13/2020   Procedure: CARPAL TUNNEL RELEASE ENDOSCOPIC;  Surgeon: Dominic Norleen PARAS, MD;  Location: [facility] ORS;  Service: Orthopedics;  Laterality: Right;   WISDOM TOOTH EXTRACTION  1993   Social History:  reports that he has been smoking cigarettes. He has never used smokeless tobacco. He reports current alcohol use. He reports that he does not use drugs.  Allergies  Allergen Reactions   Seasonal Ic [Cholestatin]     Sinus drainage year round.     No family history on file.  Prior to Admission medications   Medication Sig Start Date End Date Taking? Authorizing Provider  Ascorbic Acid (VITAMIN C) 1000 MG tablet  Take 1,000 mg by mouth daily.    [provider]  cyanocobalamin (,VITAMIN B-12,) 1000 MCG/ML injection Inject 1,000 mcg into the muscle every 14 (fourteen) days.    [provider]  Cyanocobalamin (VITAMIN B-12  CR) 1500 MCG TBCR Take 1,500 mcg by mouth daily.    [provider]  ibuprofen (ADVIL) 200 MG tablet Take 600 mg by mouth every 8 (eight) hours as needed (pain.).    [provider]  losartan -hydrochlorothiazide  (HYZAAR) 50-12.5 MG tablet Take 1 tablet by mouth daily.    [provider]  meloxicam (MOBIC) 15 MG tablet Take 15 mg by mouth daily as needed (shoulder pain.).    [provider]  Multiple Vitamins-Minerals (MULTIVITAMIN WITH MINERALS) tablet Take 2 tablets by mouth daily.    [provider]    Physical Exam: Vitals:   11/09/23 1642 11/09/23 1658 11/09/23 1750 11/09/23 1800  BP: 129/82  (!) 122/99 127/84  Pulse: 95  92 98  Resp: (!) 21  (!) 25 (!) 21  Temp:   98.7 F (37.1 C)   TempSrc:   Oral   SpO2: 100% 98% 92% 100%  Weight: 108.9 kg  113 kg   Height: 5' 7 (1.702 m)     Patient seen room 19 of the ICU Constitutional:  Vital Signs as per [facility] than three noted] No Acute Distress Eyes:  Pink Conjunctiva and no Ptosis Neck:     Trachea Midline, Neck Symmetric Respiratory:   Respiratory Effort Normal: No Use of Respiratory Muscles,No  Intercostal Retractions             Lungs Clear to Auscultation Bilaterally Cardiovascular:   Heart Auscultated: Regular Regular with harsh systolic murmur   No Lower Extremity Edema Gastrointestinal:  Abdomen soft and nontender without palpable masses, guarding or rebound  No Palpable Splenomegaly or Hepatomegaly Lymphatic:  No Palpable Cervical Lymphadenopathy or Palpable No Axillary Lymphadenopathy Psychiatric:  Patient Orientated to Time, Place and Person Patient with appropriate mood and affect Recent and Remote Memory Intact Data Reviewed: Labs, Radiology, ECG as detailed in HPI and A/P   Assessment and Plan: Syncope Syncope, loss of consciousness Patient had loss of consciousness possible cardiac arrest [place] Of note there is a harsh systolic murmur on  examination Coronary catheterization did not show significant disease as per verbal report, no evidence of dissection Differential diagnosis would be a transient arrhythmia, structural heart disease or critical valvular disease, vasospasm or myocarditis, no evidence to point to a neurological origin, obtaining CT pulmonary angiogram to rule out hemodynamically significant PE Initial troponin is 61 with serial measurements pending, initial D-dimer 0.53 Plan: Echocardiogram, CT pulmonary angiogram to rule out PE, Lipo A, lipid panel, A1c, TSH, lactic acid, cardiology consultation for ongoing review, continuous telemetry to capture malignant arrhythmias, serial examination of access radial site as per protocol  Transaminitis Transaminitis AST 50, ALT 52 Likely this is secondary to a cardiac leak/origin however we will obtain gamma GT and acute hepatitis panel to ensure not of hepatic origin  Nicotine  abuse Nicotine  abuse Smokes cigars regularly Start nicotine  patch    Intermittent dysphagia Intermittent dysphagia Outpatient follow-up   HLD (hyperlipidemia) Hyperlipidemia Starting high intensity statin atorvastatin  40 mg daily   Essential hypertension Hypertension Continue losartan  50 mg daily and hydrochlorothiazide  12.5 mg daily    Advance Care Planning:   Code Status: Full Code   Consults: Cardiology  Family Communication: Wife at bedside   Severity of Illness: The appropriate  patient status for this patient is INPATIENT. Inpatient status is judged to be reasonable and necessary in order to provide the required intensity of service to ensure the patient's safety. The patient's presenting symptoms, physical exam findings, and initial radiographic and laboratory data in the context of their chronic comorbidities is felt to place them at high risk for further clinical deterioration. Furthermore, it is not anticipated that the patient will be medically stable for discharge from the  hospital within 2 midnights of admission.   * I certify that at the point of admission it is my clinical judgment that the patient will require inpatient hospital care spanning beyond 2 midnights from the point of admission due to high intensity of service, high risk for further deterioration and high frequency of surveillance required.*  Author: Prentice JAYSON Lowenstein, MD 11/09/2023 7:02 PM  For on call review [email or web address].

## 2023-11-09 NOTE — Assessment & Plan Note (Signed)
 Nicotine  abuse Smokes cigars regularly Start nicotine  patch

## 2023-11-09 NOTE — Assessment & Plan Note (Signed)
 Transaminitis AST 50, ALT 52 Likely this is secondary to a cardiac leak/origin however we will obtain gamma GT and acute hepatitis panel to ensure not of hepatic origin

## 2023-11-10 ENCOUNTER — Inpatient Hospital Stay (HOSPITAL_COMMUNITY)
Admit: 2023-11-10 | Discharge: 2023-11-10 | Disposition: A | Discharge status: Home / Self Care | Attending: Student | Admitting: Student

## 2023-11-10 ENCOUNTER — Other Ambulatory Visit: Payer: Self-pay

## 2023-11-10 ENCOUNTER — Encounter: Payer: Self-pay | Admitting: Internal Medicine

## 2023-11-10 DIAGNOSIS — R9431 Abnormal electrocardiogram [ECG] [EKG]: Secondary | ICD-10-CM

## 2023-11-10 DIAGNOSIS — I350 Nonrheumatic aortic (valve) stenosis: Secondary | ICD-10-CM | POA: Diagnosis not present

## 2023-11-10 LAB — COMPREHENSIVE METABOLIC PANEL WITH GFR
ALT: 45 U/L — ABNORMAL HIGH (ref 0–44)
AST: 42 U/L — ABNORMAL HIGH (ref 15–41)
Albumin: 3.8 g/dL (ref 3.5–5.0)
Alkaline Phosphatase: 39 U/L (ref 38–126)
Anion gap: 8 (ref 5–15)
BUN: 14 mg/dL (ref 6–20)
CO2: 23 mmol/L (ref 22–32)
Calcium: 8.8 mg/dL — ABNORMAL LOW (ref 8.9–10.3)
Chloride: 108 mmol/L (ref 98–111)
Creatinine, Ser: 0.66 mg/dL (ref 0.61–1.24)
GFR, Estimated: >60 mL/min (ref >60)
Glucose, Bld: 113 mg/dL — ABNORMAL HIGH (ref 70–99)
Potassium: 3.9 mmol/L (ref 3.5–5.1)
Sodium: 139 mmol/L (ref 135–145)
Total Bilirubin: 0.8 mg/dL (ref 0.0–1.2)
Total Protein: 7.1 g/dL (ref 6.5–8.1)

## 2023-11-10 LAB — ECHOCARDIOGRAM COMPLETE
AR max vel: 0.65 cm2
AV Area VTI: 0.67 cm2
AV Area mean vel: 0.64 cm2
AV Mean grad: 49.3 mmHg
AV Peak grad: 78.3 mmHg
Ao pk vel: 4.43 m/s
Area-P 1/2: 3.48 cm2
Height: 67 in
S' Lateral: 2.46 cm
Weight: 3985.92 [oz_av]

## 2023-11-10 LAB — URINE DRUG SCREEN, QUALITATIVE (ARMC ONLY)
Amphetamines, Ur Screen: NOT DETECTED
Barbiturates, Ur Screen: NOT DETECTED
Benzodiazepine, Ur Scrn: POSITIVE — AB
Cannabinoid 50 Ng, Ur ~~LOC~~: POSITIVE — AB
Cocaine Metabolite,Ur ~~LOC~~: NOT DETECTED
MDMA (Ecstasy)Ur Screen: NOT DETECTED
Methadone Scn, Ur: NOT DETECTED
Opiate, Ur Screen: NOT DETECTED
Phencyclidine (PCP) Ur S: NOT DETECTED
Tricyclic, Ur Screen: NOT DETECTED

## 2023-11-10 LAB — HEPATITIS PANEL, ACUTE
HCV Ab: NONREACTIVE
Hep A IgM: NONREACTIVE
Hep B C IgM: NONREACTIVE
Hepatitis B Surface Ag: NONREACTIVE

## 2023-11-10 LAB — CBC
HCT: 41.6 % (ref 39.0–52.0)
Hemoglobin: 13.9 g/dL (ref 13.0–17.0)
MCH: 30.3 pg (ref 26.0–34.0)
MCHC: 33.4 g/dL (ref 30.0–36.0)
MCV: 90.8 fL (ref 80.0–100.0)
Platelets: 250 K/uL (ref 150–400)
RBC: 4.58 MIL/uL (ref 4.22–5.81)
RDW: 13 % (ref 11.5–15.5)
WBC: 10.7 K/uL — ABNORMAL HIGH (ref 4.0–10.5)
nRBC: 0 % (ref 0.0–0.2)

## 2023-11-10 LAB — HIV ANTIBODY (ROUTINE TESTING W REFLEX): HIV Screen 4th Generation wRfx: NONREACTIVE

## 2023-11-10 MED ORDER — SODIUM CHLORIDE FLUSH 0.9 % IV SOLN
INTRAVENOUS | Status: AC
Start: 2023-11-10 — End: 2023-11-10
  Filled 2023-11-10: qty 10

## 2023-11-10 MED ORDER — POLYETHYLENE GLYCOL 3350 17 G PO PACK: 17.0000 g | PACK | Freq: Every day | ORAL | 0 refills | Status: AC | PRN

## 2023-11-10 NOTE — Discharge Summary (Signed)
 Physician Discharge Summary   Patient: Dominic Barton MRN: [id number] DOB: Sep 11, 1970  Admit date:     11/09/2023  Discharge date: 11/10/23  Discharge Physician: Drue ONEIDA Potter   PCP: Sherial Bail, MD   Recommendations at discharge:  Outpatient follow-up with cardiologist  Discharge Diagnoses:  Syncope secondary to severe AAS Transaminitis Nicotine  abuse Intermittent dysphagia HLD (hyperlipidemia) Essential hypertension  Hospital Course: Dominic Barton is a 53 year old gentleman with a past medical history of obesity, hypertension, hyperlipidemia, smoking who was [place] the golf [place] today and after hitting his first T felt that he was feeling off some left-sided chest pain and then lost consciousness.  Reportedly a bystander performed CPR although not completely clear if the patient went into full cardiac arrest.  He reports he is at his baseline state of health without any palpitations orthopnea or previous episodes like this over the last week.    12-lead ECG independently reviewed and interpreted patient has a ventricular rate of 84 PR interval 188 QTc 456 with ST elevations in V1, AVR and ST depressions in lead II and aVF concerning for ischemic changes, official cath report is not available however case discussed with Dr. Florencio who reports coronaries were clear and no evidence of aortic dissection he specifically shot contrast for this indication.  Echocardiogram showed severe AS.  Cardiologist did not recommend cardiac monitor placement as AS likely explanation of his syncope.  Have been cleared by cardiologist for discharge today and will follow-up as an outpatient.  Consultants: Cardiology Procedures performed: As mentioned above Disposition: Home Diet recommendation:  Cardiac diet DISCHARGE MEDICATION: Allergies as of 11/10/2023       Reactions   Seasonal Ic [cholestatin]    Sinus drainage year round.         Medication List     STOP taking these medications     ibuprofen 200 MG tablet Commonly known as: ADVIL       TAKE these medications    cyanocobalamin  1000 MCG tablet Commonly known as: VITAMIN B12 Take 1,000 mcg by mouth daily.   fluticasone 50 MCG/ACT nasal spray Commonly known as: FLONASE Place 2 sprays into both nostrils daily.   losartan -hydrochlorothiazide  50-12.5 MG tablet Commonly known as: HYZAAR Take 1 tablet by mouth daily.   multivitamin with minerals tablet Take 1 tablet by mouth daily.   polyethylene glycol 17 g packet Commonly known as: MIRALAX  / GLYCOLAX  Take 17 g by mouth daily as needed for mild constipation.   simvastatin 40 MG tablet Commonly known as: ZOCOR Take 40 mg by mouth at bedtime.        Follow-up Information     Custovic, Annalee, DO. Go in 1 week(s).   Specialty: Cardiology Contact information: [address] [place] [address] [phone]                Discharge Exam: Dominic Barton   11/09/23 1642 11/09/23 1750  Weight: 108.9 kg 113 kg   General: Middle-age male in no acute distress Respiratory:      Respiratory Effort Normal: No Use of Respiratory Muscles,No  Intercostal Retractions             Lungs Clear to Auscultation Bilaterally Cardiovascular:   Heart Auscultated: Regular Regular with harsh systolic murmur   No Lower Extremity Edema Gastrointestinal:  Abdomen soft and nontender without palpable masses, guarding or rebound  No Palpable Splenomegaly or Hepatomegaly   Condition at discharge: good  The results of significant diagnostics from this hospitalization (including  imaging, microbiology, ancillary and laboratory) are listed below for reference.   Imaging Studies: ECHOCARDIOGRAM COMPLETE Result Date: 11/10/2023    ECHOCARDIOGRAM REPORT   Patient Name:   Dominic Barton Date of Exam: 11/10/2023 Medical Rec #:  [id number]        Height:       67.0 in Accession #:    [id number]       Weight:       249.1 lb Date of Birth:  09-Oct-1970        BSA:           2.220 m Patient Age:    53 years         BP:           130/74 mmHg Patient Gender: M                HR:           56 bpm. Exam Location:  [facility] Procedure: 2D Echo, Cardiac Doppler and Color Doppler (Both Spectral and Color            Flow Doppler were utilized during procedure). Indications:     Abnormal ECG  History:         Patient has no prior history of Echocardiogram examinations.                  Signs/Symptoms:Syncope; Risk Factors:Dyslipidemia, Hypertension                  and Current Smoker.  Sonographer:     Philomena Daring Referring Phys:  [id number] CARALYN HUDSON Diagnosing Phys: Sabina Custovic IMPRESSIONS  1. Left ventricular ejection fraction, by estimation, is 60 to 65%. Left ventricular ejection fraction by PLAX is 62 %. The left ventricle has normal function. The left ventricle has no regional wall motion abnormalities. Left ventricular diastolic parameters were normal.  2. Right ventricular systolic function is normal. The right ventricular size is normal.  3. The mitral valve is normal in structure. No evidence of mitral valve regurgitation. No evidence of mitral stenosis.  4. Likely bicuspid AV. The aortic valve was not well visualized. Aortic valve regurgitation is not visualized. Severe aortic valve stenosis. Aortic valve mean gradient measures 49.2 mmHg. Aortic valve Vmax measures 4.42 m/s.  5. The inferior vena cava is normal in size with greater than 50% respiratory variability, suggesting right atrial pressure of 3 mmHg. FINDINGS  Left Ventricle: Left ventricular ejection fraction, by estimation, is 60 to 65%. Left ventricular ejection fraction by PLAX is 62 %. The left ventricle has normal function. The left ventricle has no regional wall motion abnormalities. The left ventricular internal cavity size was normal in size. There is no left ventricular hypertrophy. Left ventricular diastolic parameters were normal. Right Ventricle: The right ventricular size is normal. No increase in  right ventricular wall thickness. Right ventricular systolic function is normal. Left Atrium: Left atrial size was normal in size. Right Atrium: Right atrial size was normal in size. Pericardium: There is no evidence of pericardial effusion. Mitral Valve: The mitral valve is normal in structure. No evidence of mitral valve regurgitation. No evidence of mitral valve stenosis. Tricuspid Valve: The tricuspid valve is normal in structure. Tricuspid valve regurgitation is not demonstrated. Aortic Valve: Likely bicuspid AV. The aortic valve was not well visualized. Aortic valve regurgitation is not visualized. Severe aortic stenosis is present. Aortic valve mean gradient measures 49.2 mmHg. Aortic valve peak gradient measures 78.3 mmHg. Aortic valve area, by  VTI measures 0.67 cm. Pulmonic Valve: The pulmonic valve was normal in structure. Pulmonic valve regurgitation is not visualized. Aorta: The aortic root is normal in size and structure. Venous: The inferior vena cava is normal in size with greater than 50% respiratory variability, suggesting right atrial pressure of 3 mmHg. IAS/Shunts: No atrial level shunt detected by color flow Doppler.  LEFT VENTRICLE PLAX 2D LV EF:         Left            Diastology                ventricular     LV e' medial:    6.96 cm/s                ejection        LV E/e' medial:  15.1                fraction by     LV e' lateral:   6.09 cm/s                PLAX is 62      LV E/e' lateral: 17.2                %. LVIDd:         3.67 cm LVIDs:         2.46 cm LV PW:         1.43 cm LV IVS:        1.53 cm LVOT diam:     1.80 cm LV SV:         65 LV SV Index:   29 LVOT Area:     2.54 cm  RIGHT VENTRICLE             IVC RV S prime:     15.90 cm/s  IVC diam: 2.33 cm TAPSE (M-mode): 1.9 cm LEFT ATRIUM             Index        RIGHT ATRIUM           Index LA diam:        4.10 cm 1.85 cm/m   RA Area:     22.10 cm LA Vol (A2C):   66.7 ml 30.04 ml/m  RA Volume:   57.80 ml  26.03 ml/m LA Vol  (A4C):   67.4 ml 30.36 ml/m LA Biplane Vol: 67.7 ml 30.49 ml/m  AORTIC VALVE AV Area (Vmax):    0.65 cm AV Area (Vmean):   0.64 cm AV Area (VTI):     0.67 cm AV Vmax:           442.50 cm/s AV Vmean:          334.750 cm/s AV VTI:            0.971 m AV Peak Grad:      78.3 mmHg AV Mean Grad:      49.2 mmHg LVOT Vmax:         113.50 cm/s LVOT Vmean:        83.650 cm/s LVOT VTI:          0.256 m LVOT/AV VTI ratio: 0.26  AORTA Ao Root diam: 3.00 cm Ao Asc diam:  3.30 cm MITRAL VALVE                TRICUSPID VALVE MV Area (PHT): 3.48 cm     TR Peak grad:   12.4 mmHg MV  Decel Time: 218 msec     TR Vmax:        176.00 cm/s MV E velocity: 105.00 cm/s MV A velocity: 94.10 cm/s   SHUNTS MV E/A ratio:  1.12         Systemic VTI:  0.26 m                             Systemic Diam: 1.80 cm Annalee Custovic Electronically signed by Annalee Casa Signature Date/Time: 11/10/2023/4:15:29 PM    Final    CT Angio Chest Pulmonary Embolism (PE) W or WO Contrast Result Date: 11/09/2023 CLINICAL DATA:  Concern for pulmonary embolism. EXAM: CT ANGIOGRAPHY CHEST WITH CONTRAST TECHNIQUE: Multidetector CT imaging of the chest was performed using the standard protocol during bolus administration of intravenous contrast. Multiplanar CT image reconstructions and MIPs were obtained to evaluate the vascular anatomy. RADIATION DOSE REDUCTION: This exam was performed according to the departmental dose-optimization program which includes automated exposure control, adjustment of the mA and/or kV according to patient size and/or use of iterative reconstruction technique. CONTRAST:  75mL OMNIPAQUE  IOHEXOL  350 MG/ML SOLN COMPARISON:  None Available. FINDINGS: Cardiovascular: There is no cardiomegaly or pericardial effusion. There is calcification of the aortic valve. The thoracic aorta is unremarkable. The origins of the vessels of the aortic arch are patent. Evaluation of the pulmonary arteries is limited due to suboptimal opacification and  timing of the contrast. No central pulmonary artery embolus identified. Mediastinum/Nodes: No hilar or mediastinal adenopathy. The esophagus and the thyroid gland are grossly unremarkable no mediastinal fluid collection. Lungs/Pleura: No focal consolidation, pleural effusion or pneumothorax. The central airways are patent. Upper Abdomen: No acute abnormality. Musculoskeletal: No acute osseous pathology. Degenerative changes of the spine. Review of the MIP images confirms the above findings. IMPRESSION: 1. No acute intrathoracic pathology. No CT evidence of central pulmonary artery embolus. 2.  Aortic Atherosclerosis (ICD10-I70.0). Electronically Signed   By: Vanetta Chou M.D.   On: 11/09/2023 18:42    Microbiology: Results for orders placed or performed during the hospital encounter of 11/09/23  MRSA Next Gen by PCR, Nasal     Status: None   Collection Time: 11/09/23  5:51 PM   Specimen: Nasal Mucosa; Nasal Swab  Result Value Ref Range Status   MRSA by PCR Next Gen NOT DETECTED NOT DETECTED Final    Comment: (NOTE) The GeneXpert MRSA Assay (FDA approved for NASAL specimens only), is one component of a comprehensive MRSA colonization surveillance program. It is not intended to diagnose MRSA infection nor to guide or monitor treatment for MRSA infections. Test performance is not FDA approved in patients less than 19 years old. Performed at [facility], [address] [place], [place] [address]     Labs: CBC: Recent Labs  Lab 11/09/23 1645 11/09/23 1927 11/10/23 0446  WBC 10.5 15.1* 10.7*  NEUTROABS 5.4  --   --   HGB 14.3 14.0 13.9  HCT 42.2 41.2 41.6  MCV 91.5 90.2 90.8  PLT 270 258 250   Basic Metabolic Panel: Recent Labs  Lab 11/09/23 1645 11/09/23 1927 11/10/23 0446  NA 141  --  139  K 3.9  --  3.9  CL 107  --  108  CO2 22  --  23  GLUCOSE 137*  --  113*  BUN 22*  --  14  CREATININE 0.95 0.78 0.66  CALCIUM  9.0  --  8.8*   Liver Function  Tests: Recent Labs  Lab 11/09/23 1645 11/10/23 0446  AST 50* 42*  ALT 52* 45*  ALKPHOS 44 39  BILITOT 0.6 0.8  PROT 7.4 7.1  ALBUMIN 4.2 3.8   CBG: Recent Labs  Lab 11/09/23 1749  GLUCAP 147*    Discharge time spent:  50 minutes.  Signed: Drue ONEIDA Potter, MD [facility] 11/10/2023

## 2023-11-10 NOTE — TOC CM/SW Note (Signed)
 Transition of Care [facility]) - Inpatient Brief Assessment   Patient Details  Name: Dominic Barton MRN: [id number] Date of Birth: May 14, 1970  Transition of Care [facility]) CM/SW Contact:    Corean ONEIDA Haddock, RN Phone Number: 11/10/2023, 12:06 PM   Clinical Narrative:    Transition of Care [facility]) Screening Note   Patient Details  Name: Dominic Barton Date of Birth: 08-12-1970   Transition of Care [facility]) CM/SW Contact:    Corean ONEIDA Haddock, RN Phone Number: 11/10/2023, 12:06 PM    Transition of Care Department [facility]) has reviewed patient and no TOC needs have been identified at this time.. If new patient transition needs arise, please place a TOC consult.   Transition of Care Asessment: Insurance and Status: Insurance coverage has been reviewed Patient has primary care physician: Yes     Prior/Current Home Services: No current home services   Readmission risk has been reviewed: Yes Transition of care needs: no transition of care needs at this time

## 2023-11-10 NOTE — Plan of Care (Signed)
   Problem: Education: Goal: Knowledge of General Education information will improve Description: Including pain rating scale, medication(s)/side effects and non-pharmacologic comfort measures Outcome: Progressing   Problem: Clinical Measurements: Goal: Ability to maintain clinical measurements within normal limits will improve Outcome: Progressing Goal: Will remain free from infection Outcome: Progressing

## 2023-11-10 NOTE — Progress Notes (Signed)
 Patient transferred. AxOx4. VSS. All patient belongings kept at bedside transported to new room with patient's mother.

## 2023-11-10 NOTE — Progress Notes (Cosign Needed Addendum)
 [facility] CARDIOLOGY PROGRESS NOTE       Patient ID: Dominic Barton MRN: [id number] DOB/AGE: 1970-07-16 53 y.o.  Admit date: 11/09/2023 Referring Physician STEMI (Dr. Florencio) Primary Physician Sherial Bail, MD Primary Cardiologist None Reason for Consultation STEMI  HPI: Dominic Barton is a 53 y.o. male  with a past medical history of hypertension, hyperlipidemia, OSA (not on CPAP), obesity, tobacco use who presented to the ED on 11/09/2023 for syncopal episode.  Reportedly a bystander performed CPR although not completely clear if patient went into full cardiac arrest unsure if pulse was lost.  Patient has history of multiple syncopal episodes in the past associated with vigorous coughing.  Patient denies history of CAD, MI or HF.  Upon arrival to ED code STEMI was initiated and patient was urgently brought back to catheterization lab and underwent diagnostic LHC that revealed no significant CAD or aortic dissection per Dr. Florencio.  Interval History: -Patient seen and examined this AM and laying comfortably in hospital bed family at bedside.  Patient states he feels well this a.m. and denies pain, SOB, lightheadedness/dizziness or recurrence of syncope..  -Patients BP and HR stable this AM. Overnight Tele showed no significant events.  -Patient remains on room air with stable SpO2.  -Right radial incision site is clean and dry with no evidence of significant swelling, bruising, or active bleeding.  Pertinent Cardiac History (Most recent) LHC (09/14): No significant CAD, no stent intervention.  Review of systems complete and found to be negative unless listed above    Past Medical History:  Diagnosis Date   Anemia 01/2020   bitamin b12 deficiency   Bilateral hand numbness    Excessive daytime sleepiness    Hypertension    Sleep apnea 03/02/2020   in the process of being worked up for sleep apnea   Tendonitis of shoulder, left 2021    Past Surgical History:   Procedure Laterality Date   CARPAL TUNNEL RELEASE Left 03/08/2020   Procedure: CARPAL TUNNEL RELEASE ENDOSCOPIC;  Surgeon: Edie Norleen PARAS, MD;  Location: [facility] ORS;  Service: Orthopedics;  Laterality: Left;   CARPAL TUNNEL RELEASE Right 04/13/2020   Procedure: CARPAL TUNNEL RELEASE ENDOSCOPIC;  Surgeon: Edie Norleen PARAS, MD;  Location: [facility] ORS;  Service: Orthopedics;  Laterality: Right;   WISDOM TOOTH EXTRACTION  1993    Medications Prior to Admission  Medication Sig Dispense Refill Last Dose/Taking   cyanocobalamin  (VITAMIN B12) 1000 MCG tablet Take 1,000 mcg by mouth daily.   Taking   fluticasone (FLONASE) 50 MCG/ACT nasal spray Place 2 sprays into both nostrils daily.   Taking   ibuprofen (ADVIL) 200 MG tablet Take 600 mg by mouth every 8 (eight) hours as needed (pain.).   Taking As Needed   losartan -hydrochlorothiazide  (HYZAAR) 50-12.5 MG tablet Take 1 tablet by mouth daily.   Taking   Multiple Vitamins-Minerals (MULTIVITAMIN WITH MINERALS) tablet Take 1 tablet by mouth daily.   Taking   simvastatin (ZOCOR) 40 MG tablet Take 40 mg by mouth at bedtime.   Taking   Social History   Socioeconomic History   Marital status: Single    Spouse name: Not on file   Number of children: 1   Years of education: Not on file   Highest education level: Not on file  Occupational History   Occupation: maintenance work, Music therapist  Tobacco Use   Smoking status: Every Day    Current packs/day: 1.50    Types: Cigarettes   Smokeless tobacco: Never  Vaping  Use   Vaping status: Never Used  Substance and Sexual Activity   Alcohol use: Yes    Comment: 3-4 beers a day or some days   Drug use: No   Sexual activity: Yes  Other Topics Concern   Not on file  Social History Narrative   Patient lives with his mother and daughter (part time).   Feels safe in his home.   Social Drivers of Corporate investment banker Strain: Low Risk  (09/19/2023)   Received from [facility]   Overall  Financial Resource Strain (CARDIA)    Difficulty of Paying Living Expenses: Not hard at all  Food Insecurity: No Food Insecurity (09/19/2023)   Received from [facility]   Hunger Vital Sign    Within the past 12 months, you worried that your food would run out before you got the money to buy more.: Never true    Within the past 12 months, the food you bought just didn't last and you didn't have money to get more.: Never true  Transportation Needs: No Transportation Needs (09/19/2023)   Received from [facility] - Transportation    In the past 12 months, has lack of transportation kept you from medical appointments or from getting medications?: No    Lack of Transportation (Non-Medical): No  Physical Activity: Not on file  Stress: Not on file  Social Connections: Not on file  Intimate Partner Violence: Not on file    No family history on file.   Vitals:   11/10/23 0745 11/10/23 0800 11/10/23 0900 11/10/23 1004  BP:  135/67  130/74  Pulse:  (!) 56 66 69  Resp:  (!) 21 (!) 24 (!) 22  Temp: 98.1 F (36.7 C)   98.6 F (37 C)  TempSrc: Oral   Oral  SpO2:  96% 97% 98%  Weight:      Height:        PHYSICAL EXAM General: Chronically ill-appearing male, well nourished, in no acute distress. HEENT: Normocephalic and atraumatic. Neck: No JVD.   Lungs: Normal respiratory effort on room air. Clear bilaterally to auscultation. No wheezes, crackles, rhonchi.  Heart: HRRR. Normal S1 and S2, + murmur.  Abdomen: Non-distended appearing.  Msk: Normal strength and tone for age. Extremities: Warm and well perfused. No clubbing, cyanosis, edema.  Neuro: Alert and oriented X 3. Psych: Answers questions appropriately.   Labs: Basic Metabolic Panel: Recent Labs    11/09/23 1645 11/09/23 1927 11/10/23 0446  NA 141  --  139  K 3.9  --  3.9  CL 107  --  108  CO2 22  --  23  GLUCOSE 137*  --  113*  BUN 22*  --  14  CREATININE 0.95 0.78 0.66   CALCIUM  9.0  --  8.8*   Liver Function Tests: Recent Labs    11/09/23 1645 11/10/23 0446  AST 50* 42*  ALT 52* 45*  ALKPHOS 44 39  BILITOT 0.6 0.8  PROT 7.4 7.1  ALBUMIN 4.2 3.8   No results for input(s): LIPASE, AMYLASE in the last 72 hours. CBC: Recent Labs    11/09/23 1645 11/09/23 1927 11/10/23 0446  WBC 10.5 15.1* 10.7*  NEUTROABS 5.4  --   --   HGB 14.3 14.0 13.9  HCT 42.2 41.2 41.6  MCV 91.5 90.2 90.8  PLT 270 258 250   Cardiac Enzymes: Recent Labs    11/09/23 1645 11/09/23 1927  TROPONINIHS  61* 501*   BNP: No results for input(s): BNP in the last 72 hours. D-Dimer: Recent Labs    11/09/23 1645  DDIMER 0.53*   Hemoglobin A1C: Recent Labs    11/09/23 1645  HGBA1C 5.5   Fasting Lipid Panel: Recent Labs    11/09/23 1645  CHOL 165  HDL 38*  LDLCALC 93  TRIG 827*  CHOLHDL 4.3   Thyroid Function Tests: Recent Labs    11/09/23 1927  TSH 1.737   Anemia Panel: No results for input(s): VITAMINB12, FOLATE, FERRITIN, TIBC, IRON, RETICCTPCT in the last 72 hours.   Radiology: CT Angio Chest Pulmonary Embolism (PE) W or WO Contrast Result Date: 11/09/2023 CLINICAL DATA:  Concern for pulmonary embolism. EXAM: CT ANGIOGRAPHY CHEST WITH CONTRAST TECHNIQUE: Multidetector CT imaging of the chest was performed using the standard protocol during bolus administration of intravenous contrast. Multiplanar CT image reconstructions and MIPs were obtained to evaluate the vascular anatomy. RADIATION DOSE REDUCTION: This exam was performed according to the departmental dose-optimization program which includes automated exposure control, adjustment of the mA and/or kV according to patient size and/or use of iterative reconstruction technique. CONTRAST:  75mL OMNIPAQUE  IOHEXOL  350 MG/ML SOLN COMPARISON:  None Available. FINDINGS: Cardiovascular: There is no cardiomegaly or pericardial effusion. There is calcification of the aortic valve. The thoracic  aorta is unremarkable. The origins of the vessels of the aortic arch are patent. Evaluation of the pulmonary arteries is limited due to suboptimal opacification and timing of the contrast. No central pulmonary artery embolus identified. Mediastinum/Nodes: No hilar or mediastinal adenopathy. The esophagus and the thyroid gland are grossly unremarkable no mediastinal fluid collection. Lungs/Pleura: No focal consolidation, pleural effusion or pneumothorax. The central airways are patent. Upper Abdomen: No acute abnormality. Musculoskeletal: No acute osseous pathology. Degenerative changes of the spine. Review of the MIP images confirms the above findings. IMPRESSION: 1. No acute intrathoracic pathology. No CT evidence of central pulmonary artery embolus. 2.  Aortic Atherosclerosis (ICD10-I70.0). Electronically Signed   By: Vanetta Chou M.D.   On: 11/09/2023 18:42    ECHO pending  TELEMETRY reviewed by me 11/10/2023: Sinus rhythm, rate 60s  EKG reviewed by me: ST elevation V1, aVR, ST depression lead II, aVF  Data reviewed by me 11/10/2023: last 24h vitals tele labs imaging I/O hospitalist progress notes.  Principal Problem:   STEMI (ST elevation myocardial infarction) (HCC) Active Problems:   Syncope   Transaminitis   Essential hypertension   HLD (hyperlipidemia)   Intermittent dysphagia   Nicotine  abuse    ASSESSMENT AND PLAN:  Dominic Barton is a 53 y.o. male  with a past medical history of hypertension, hyperlipidemia, OSA (not on CPAP), obesity, tobacco use who presented to the ED on 11/09/2023 for syncopal episode.  Reportedly a bystander performed CPR although not completely clear if patient went into full cardiac arrest unsure if pulse was lost.  Patient has history of multiple syncopal episodes in the past associated with vigorous coughing.  Patient denies history of CAD, MI or HF.  Upon arrival to ED code STEMI was initiated and patient was urgently brought back to catheterization lab  and underwent diagnostic LHC that revealed no significant CAD or aortic dissection per Dr. Florencio.  # Syncopal episode with exertion # Severe Aortic Stenosis # Hypertension # Hyperlipidemia # OSA (not on CPAP) Patient underwent emergent LHC on 09/14 with Dr. Florencio that revealed no significant CAD, no stent intervention. No significant arrhthymias noted on telemetry.  -Monitor and replenish electrolytes  for a goal K >4, Mag >2  -Continue atorvastatin  40 mg daily. -Continue losartan  50 mg daily, HCTZ 12.5 mg daily. -Recommend follow-up with PCP for CPAP fitting.  -Echo this admission revealed severe aortic stenosis with AV mean gradient measures 49.2 mmHg. AV peak gradient measures 78.3 mmHg.  VTI measures 0.67 cm. Syncopal episode on exertion likely related to severe AS. Plan to refer patient to [facility] cardiac surgery for surgical intervention of AV.   Ok for discharge today from a cardiac perspective. Follow up in clinic with Dr. Dewane on 09/17 at 11AM. Will place [facility] cardiac surgery referral for severe aortic stenosis.  This patient's plan of care was discussed and created with Dr. Custovic and she is in agreement.  Signed: Carmine Youngberg, PA-C  11/10/2023, 11:00 AM [facility]

## 2023-11-10 NOTE — Progress Notes (Signed)
   11/10/23 0700  Spiritual Encounters  Type of Visit Follow up  Care provided to: Pt and family  Conversation partners present during encounter Other (comment) Training and development officer)  Reason for visit Routine spiritual support  OnCall Visit Yes   Chaplain followed up with patient after Code Stemi earlier in the evening.  Patient's fiance was at the bedside.  Chaplain offered a compassionate presence and shared that Chaplains are available as/if needed.    Rev. Rana M. Nicholaus, M.Div. Chaplain Resident [facility]

## 2023-11-10 NOTE — Plan of Care (Signed)
  Problem: Health Behavior/Discharge Planning: Goal: Ability to manage health-related needs will improve Outcome: Progressing   Problem: Clinical Measurements: Goal: Ability to maintain clinical measurements within normal limits will improve Outcome: Progressing Goal: Will remain free from infection Outcome: Progressing Goal: Diagnostic test results will improve Outcome: Progressing Goal: Respiratory complications will improve Outcome: Progressing   Problem: Activity: Goal: Risk for activity intolerance will decrease Outcome: Progressing   Problem: Nutrition: Goal: Adequate nutrition will be maintained Outcome: Progressing   Problem: Coping: Goal: Level of anxiety will decrease Outcome: Progressing   Problem: Elimination: Goal: Will not experience complications related to bowel motility Outcome: Progressing Goal: Will not experience complications related to urinary retention Outcome: Progressing   Problem: Pain Managment: Goal: General experience of comfort will improve and/or be controlled Outcome: Progressing

## 2023-11-11 ENCOUNTER — Encounter: Payer: Self-pay | Admitting: Internal Medicine

## 2023-11-11 LAB — CARDIAC CATHETERIZATION: Cath EF Quantitative: 65 %

## 2023-11-11 LAB — LIPOPROTEIN A (LPA): Lipoprotein (a): 14.5 nmol/L (ref <75.0)

## 2024-01-11 NOTE — Consult Note (Signed)
 CARDIOLOGY CONSULT NOTE               Patient ID: Dominic Barton MRN: [id number] DOB/AGE: [birth year] 53 y.o.  Admit date: 11/09/2023 Referring Physician Dr. Ester Sharps, ER Primary Physician Dr. Sherial Glenn clinic Primary Cardiologist  Reason for Consultation STEMI  HPI: Patient is a 53 year old male with obesity hypertension hyperlipidemia obstructive sleep apnea was playing golf [place] the golf [place] and hitting off the first TV and felt off had some left-sided chest pain lost consciousness bystander CPR he was brought in as a code STEMI EKG was borderline but he was taken to the cardiac Cath Lab for evaluation.  Denies any previous cardiac history no previous myocardial infarction cath or stents.  Patient has a history of obstructive sleep apnea with chest pain he was taken directly to the cardiac Cath Lab for cardiac evaluation  Review of systems complete and found to be negative unless listed above     Past Medical History:  Diagnosis Date   Anemia 01/2020   bitamin b12 deficiency   Bilateral hand numbness    Excessive daytime sleepiness    Hypertension    Sleep apnea 03/02/2020   in the process of being worked up for sleep apnea   Tendonitis of shoulder, left 2021    Past Surgical History:  Procedure Laterality Date   CARPAL TUNNEL RELEASE Left 03/08/2020   Procedure: CARPAL TUNNEL RELEASE ENDOSCOPIC;  Surgeon: Edie Norleen PARAS, MD;  Location: [facility] ORS;  Service: Orthopedics;  Laterality: Left;   CARPAL TUNNEL RELEASE Right 04/13/2020   Procedure: CARPAL TUNNEL RELEASE ENDOSCOPIC;  Surgeon: Edie Norleen PARAS, MD;  Location: [facility] ORS;  Service: Orthopedics;  Laterality: Right;   LEFT HEART CATH AND CORONARY ANGIOGRAPHY N/A 11/09/2023   Procedure: LEFT HEART CATH AND CORONARY ANGIOGRAPHY;  Surgeon: Florencio Cara BIRCH, MD;  Location: [facility] INVASIVE CV LAB;  Service: Cardiovascular;  Laterality: N/A;   WISDOM TOOTH EXTRACTION  1993    No medications prior to admission.    Social History   Socioeconomic History   Marital status: Single    Spouse name: Not on file   Number of children: 1   Years of education: Not on file   Highest education level: Not on file  Occupational History   Occupation: maintenance work, music therapist  Tobacco Use   Smoking status: Every Day    Current packs/day: 1.50    Types: Cigarettes   Smokeless tobacco: Never  Vaping Use   Vaping status: Never Used  Substance and Sexual Activity   Alcohol use: Yes    Comment: 3-4 beers a day or some days   Drug use: No   Sexual activity: Yes  Other Topics Concern   Not on file  Social History Narrative   Patient lives with his mother and daughter (part time).   Feels safe in his home.   Social Drivers of Corporate Investment Banker Strain: Low Risk  (01/06/2024)   Received from [facility]   Overall Financial Resource Strain (CARDIA)    Difficulty of Paying Living Expenses: Not hard at all  Recent Concern: Financial Resource Strain - High Risk (11/12/2023)   Received from [facility]   Overall Financial Resource Strain (CARDIA)    Difficulty of Paying Living Expenses: Hard  Food Insecurity: No Food Insecurity (01/06/2024)   Received from [facility]   Hunger Vital Sign    Within the past 12 months, you worried that your  food would run out before you got the money to buy more.: Never true    Within the past 12 months, the food you bought just didn't last and you didn't have money to get more.: Never true  Recent Concern: Food Insecurity - Food Insecurity Present (11/12/2023)   Received from [facility]   Hunger Vital Sign    Within the past 12 months, you worried that your food would run out before you got the money to buy more.: Sometimes true    Within the past 12 months, the food you bought just didn't last and you didn't have money to get more.: Sometimes true  Transportation Needs: No Transportation  Needs (01/06/2024)   Received from [facility] - Transportation    In the past 12 months, has lack of transportation kept you from medical appointments or from getting medications?: No    Lack of Transportation (Non-Medical): No  Physical Activity: Not on file  Stress: Not on file  Social Connections: Not on file  Intimate Partner Violence: Not At Risk (11/10/2023)   Humiliation, Afraid, Rape, and Kick questionnaire    Fear of Current or Ex-Partner: No    Emotionally Abused: No    Physically Abused: No    Sexually Abused: No    History reviewed. No pertinent family history.    Review of systems complete and found to be negative unless listed above      PHYSICAL EXAM  General: Well developed, well nourished, in no acute distress HEENT:  Normocephalic and atramatic Neck:  No JVD.  Lungs: Clear bilaterally to auscultation and percussion. Heart: HRRR . Normal S1 and S2 without gallops or murmurs.  Abdomen: Bowel sounds are positive, abdomen soft and non-tender  Msk:  Back normal, normal gait. Normal strength and tone for age. Extremities: No clubbing, cyanosis or edema.   Neuro: Alert and oriented X 3. Psych:  Good affect, responds appropriately  Labs:   Lab Results  Component Value Date   WBC 10.7 (H) 11/10/2023   HGB 13.9 11/10/2023   HCT 41.6 11/10/2023   MCV 90.8 11/10/2023   PLT 250 11/10/2023   No results for input(s): NA, K, CL, CO2, BUN, CREATININE, CALCIUM , PROT, BILITOT, ALKPHOS, ALT, AST, GLUCOSE in the last 168 hours.  Invalid input(s): LABALBU No results found for: CKTOTAL, CKMB, CKMBINDEX, TROPONINI  Lab Results  Component Value Date   CHOL 165 11/09/2023   Lab Results  Component Value Date   HDL 38 (L) 11/09/2023   Lab Results  Component Value Date   LDLCALC 93 11/09/2023   Lab Results  Component Value Date   TRIG 172 (H) 11/09/2023   Lab Results  Component Value Date   CHOLHDL  4.3 11/09/2023   No results found for: LDLDIRECT    Radiology: No results found.  EKG: Normal sinus rhythm diffuse ST depression inferior laterally slight elevation in aVR  ASSESSMENT AND PLAN:  STEMI presentation Hypertension Obesity Shortness of breath Abnormal EKG Aortic valve disease Obstructive sleep apnea Syncope . Plan STEMI presentation patient taken to cardiac Cath Lab for further evaluation Cardiac cath showed no obstructive disease but significant aortic valvular disease Statin therapy for hyperlipidemia Continue hypertension management control Echocardiogram for assessment of left ventricular function and valvular structures Consider CTA to rule out pulmonary embolus Will base further recommendations on results of a cardiac cath   Signed: Cara JONETTA Lovelace MD, 01/11/2024, 4:07 PM

## 2024-02-23 ENCOUNTER — Other Ambulatory Visit: Payer: Self-pay

## 2024-02-23 ENCOUNTER — Encounter

## 2024-02-23 DIAGNOSIS — Z952 Presence of prosthetic heart valve: Secondary | ICD-10-CM | POA: Insufficient documentation

## 2024-02-23 DIAGNOSIS — Z48812 Encounter for surgical aftercare following surgery on the circulatory system: Secondary | ICD-10-CM | POA: Insufficient documentation

## 2024-02-23 NOTE — Progress Notes (Signed)
 Initial phone call completed. Diagnosis can be found in [facility] 01/26/2024. EP Orientation scheduled for Tuesday, February 24, 2024.

## 2024-02-24 ENCOUNTER — Encounter

## 2024-02-24 VITALS — Ht 66.5 in | Wt 254.1 lb

## 2024-02-24 DIAGNOSIS — Z952 Presence of prosthetic heart valve: Secondary | ICD-10-CM

## 2024-02-24 DIAGNOSIS — Z48812 Encounter for surgical aftercare following surgery on the circulatory system: Secondary | ICD-10-CM | POA: Diagnosis present

## 2024-02-24 NOTE — Patient Instructions (Signed)
 Patient Instructions  Patient Details  Name: Dominic Barton MRN: [id number] Date of Birth: 06-24-1970 Referring Provider:  Custovic, Sabina, DO  Below are your personal goals for exercise, nutrition, and risk factors. Our goal is to help you stay on track towards obtaining and maintaining these goals. We will be discussing your progress on these goals with you throughout the program.  Initial Exercise Prescription:  Initial Exercise Prescription - 02/24/24 1100       Date of Initial Exercise RX and Referring Provider   Date 02/24/24    Referring Provider Dr. Sabina Custovic, DO      Oxygen   Maintain Oxygen Saturation 88% or higher      Treadmill   MPH 2.6    Grade 0    Minutes 15    METs 2.99      NuStep   Level 3    SPM 80    Minutes 15    METs 3.41      REL-XR   Level 3    Speed 50    Minutes 15    METs 3.41      Prescription Details   Frequency (times per week) 3    Duration Progress to 30 minutes of continuous aerobic without signs/symptoms of physical distress      Intensity   THRR 40-80% of Max Heartrate 137-158    Ratings of Perceived Exertion 11-13    Perceived Dyspnea 0-4      Progression   Progression Continue to progress workloads to maintain intensity without signs/symptoms of physical distress.      Resistance Training   Training Prescription Yes    Weight 5 lb    Reps 10-15          Exercise Goals: Frequency: Be able to perform aerobic exercise two to three times per week in program working toward 2-5 days per week of home exercise.  Intensity: Work with a perceived exertion of 11 (fairly light) - 15 (hard) while following your exercise prescription.  We will make changes to your prescription with you as you progress through the program.   Duration: Be able to do 30 to 45 minutes of continuous aerobic exercise in addition to a 5 minute warm-up and a 5 minute cool-down routine.   Nutrition Goals: Your personal nutrition goals will be  established when you do your nutrition analysis with the dietician.  The following are general nutrition guidelines to follow: Cholesterol < 200mg /day Sodium < 1500mg /day Fiber: Men over 50 yrs - 30 grams per day  Personal Goals:  Personal Goals and Risk Factors at Admission - 02/23/24 1411       Core Components/Risk Factors/Patient Goals on Admission    Weight Management Weight Loss    Hypertension Yes    Intervention Provide education on lifestyle modifcations including regular physical activity/exercise, weight management, moderate sodium restriction and increased consumption of fresh fruit, vegetables, and low fat dairy, alcohol moderation, and smoking cessation.;Monitor prescription use compliance.    Expected Outcomes Short Term: Continued assessment and intervention until BP is < 140/14mm HG in hypertensive participants. < 130/82mm HG in hypertensive participants with diabetes, heart failure or chronic kidney disease.;Long Term: Maintenance of blood pressure at goal levels.    Lipids Yes    Intervention Provide education and support for participant on nutrition & aerobic/resistive exercise along with prescribed medications to achieve LDL 70mg , HDL >40mg .    Expected Outcomes Short Term: Participant states understanding of desired cholesterol values and is compliant  with medications prescribed. Participant is following exercise prescription and nutrition guidelines.;Long Term: Cholesterol controlled with medications as prescribed, with individualized exercise RX and with personalized nutrition plan. Value goals: LDL < 70mg , HDL > 40 mg.         Exercise Goals and Review:  Exercise Goals     Row Name 02/24/24 1116             Exercise Goals   Increase Physical Activity Yes       Intervention Provide advice, education, support and counseling about physical activity/exercise needs.;Develop an individualized exercise prescription for aerobic and resistive training based on  initial evaluation findings, risk stratification, comorbidities and participant's personal goals.       Expected Outcomes Short Term: Attend rehab on a regular basis to increase amount of physical activity.;Long Term: Add in home exercise to make exercise part of routine and to increase amount of physical activity.;Long Term: Exercising regularly at least 3-5 days a week.       Increase Strength and Stamina Yes       Intervention Provide advice, education, support and counseling about physical activity/exercise needs.;Develop an individualized exercise prescription for aerobic and resistive training based on initial evaluation findings, risk stratification, comorbidities and participant's personal goals.       Expected Outcomes Short Term: Perform resistance training exercises routinely during rehab and add in resistance training at home;Short Term: Increase workloads from initial exercise prescription for resistance, speed, and METs.;Long Term: Improve cardiorespiratory fitness, muscular endurance and strength as measured by increased METs and functional capacity ( )       Able to understand and use rate of perceived exertion (RPE) scale Yes       Intervention Provide education and explanation on how to use RPE scale       Expected Outcomes Short Term: Able to use RPE daily in rehab to express subjective intensity level;Long Term:  Able to use RPE to guide intensity level when exercising independently       Able to understand and use Dyspnea scale Yes       Intervention Provide education and explanation on how to use Dyspnea scale       Expected Outcomes Short Term: Able to use Dyspnea scale daily in rehab to express subjective sense of shortness of breath during exertion;Long Term: Able to use Dyspnea scale to guide intensity level when exercising independently       Knowledge and understanding of Target Heart Rate Range (THRR) Yes       Intervention Provide education and explanation of THRR  including how the numbers were predicted and where they are located for reference       Expected Outcomes Short Term: Able to state/look up THRR;Long Term: Able to use THRR to govern intensity when exercising independently;Short Term: Able to use daily as guideline for intensity in rehab       Able to check pulse independently Yes       Intervention Provide education and demonstration on how to check pulse in carotid and radial arteries.;Review the importance of being able to check your own pulse for safety during independent exercise       Expected Outcomes Short Term: Able to explain why pulse checking is important during independent exercise;Long Term: Able to check pulse independently and accurately       Understanding of Exercise Prescription Yes       Intervention Provide education, explanation, and written materials on patient's individual exercise prescription  Expected Outcomes Short Term: Able to explain program exercise prescription;Long Term: Able to explain home exercise prescription to exercise independently

## 2024-02-24 NOTE — Progress Notes (Signed)
 Cardiac Individual Treatment Plan  Patient Details  Name: Dominic Barton MRN: [id number] Date of Birth: [birth year] Referring Provider:   Flowsheet Row Cardiac Rehab from 02/24/2024 in [facility] Cardiac and Pulmonary Rehab  Referring Provider Dr. Annalee Casa, DO    Initial Encounter Date:  Flowsheet Row Cardiac Rehab from 02/24/2024 in [facility] Cardiac and Pulmonary Rehab  Date 02/24/24    Visit Diagnosis: S/P AVR (aortic valve replacement)  Patient's Home Medications on Admission: Current Medications[1]  Past Medical History: Past Medical History:  Diagnosis Date   Anemia 01/2020   bitamin b12 deficiency   Bilateral hand numbness    Excessive daytime sleepiness    Hypertension    Sleep apnea 03/02/2020   in the process of being worked up for sleep apnea   Tendonitis of shoulder, left 2021    Tobacco Use: Tobacco Use History[2]  Labs: Review Flowsheet       Latest Ref Rng & Units 11/09/2023  Labs for ITP Cardiac and Pulmonary Rehab  Cholestrol 0 - 200 mg/dL 834   LDL (calc) 0 - 99 mg/dL 93   HDL-C >59 mg/dL 38   Trlycerides <849 mg/dL 827   Hemoglobin J8r 4.8 - 5.6 % 5.5      Exercise Target Goals: Exercise Program Goal: Individual exercise prescription set using results from initial 6 min walk test and THRR while considering  patients activity barriers and safety.   Exercise Prescription Goal: Initial exercise prescription builds to 30-45 minutes a day of aerobic activity, 2-3 days per week.  Home exercise guidelines will be given to patient during program as part of exercise prescription that the participant will acknowledge.   Education: Aerobic Exercise: - Group verbal and visual presentation on the components of exercise prescription. Introduces F.I.T.T principle from ACSM for exercise prescriptions.  Reviews F.I.T.T. principles of aerobic exercise including progression. Written material provided at class time.   Education: Resistance Exercise: - Group  verbal and visual presentation on the components of exercise prescription. Introduces F.I.T.T principle from ACSM for exercise prescriptions  Reviews F.I.T.T. principles of resistance exercise including progression. Written material provided at class time.    Education: Exercise & Equipment Safety: - Individual verbal instruction and demonstration of equipment use and safety with use of the equipment. Flowsheet Row Cardiac Rehab from 02/24/2024 in [facility] Cardiac and Pulmonary Rehab  Date 02/24/24  Educator NT  Instruction Review Code 1- Verbalizes Understanding    Education: Exercise Physiology & General Exercise Guidelines: - Group verbal and written instruction with models to review the exercise physiology of the cardiovascular system and associated critical values. Provides general exercise guidelines with specific guidelines to those with heart or lung disease. Written material provided at class time.   Education: Flexibility, Balance, Mind/Body Relaxation: - Group verbal and visual presentation with interactive activity on the components of exercise prescription. Introduces F.I.T.T principle from ACSM for exercise prescriptions. Reviews F.I.T.T. principles of flexibility and balance exercise training including progression. Also discusses the mind body connection.  Reviews various relaxation techniques to help reduce and manage stress (i.e. Deep breathing, progressive muscle relaxation, and visualization). Balance handout provided to take home. Written material provided at class time.   Activity Barriers & Risk Stratification:  Activity Barriers & Cardiac Risk Stratification - 02/23/24 1410       Activity Barriers & Cardiac Risk Stratification   Activity Barriers Arthritis;Deconditioning;Other (comment)    Comments per patient: L shoulder has 3 tears    Cardiac Risk Stratification Moderate  6 Minute Walk:  6 Minute Walk     Row Name 02/24/24 1109         6 Minute Walk    Phase Initial     Distance 1385 feet     Walk Time 6 minutes     # of Rest Breaks 0     MPH 2.62     METS 3.41     RPE 13     Perceived Dyspnea  2     VO2 Peak 11.92     Symptoms Yes (comment)     Comments lightheadedness     Resting HR 117 bpm     Resting BP 108/72     Resting Oxygen Saturation  95 %     Exercise Oxygen Saturation  during 6 min walk 96 %     Max Ex. HR 117 bpm     Max Ex. BP 120/74     2 Minute Post BP 116/74        Oxygen Initial Assessment:   Oxygen Re-Evaluation:   Oxygen Discharge (Final Oxygen Re-Evaluation):   Initial Exercise Prescription:  Initial Exercise Prescription - 02/24/24 1100       Date of Initial Exercise RX and Referring Provider   Date 02/24/24    Referring Provider Dr. Sabina Custovic, DO      Oxygen   Maintain Oxygen Saturation 88% or higher      Treadmill   MPH 2.6    Grade 0    Minutes 15    METs 2.99      NuStep   Level 3    SPM 80    Minutes 15    METs 3.41      REL-XR   Level 3    Speed 50    Minutes 15    METs 3.41      Prescription Details   Frequency (times per week) 3    Duration Progress to 30 minutes of continuous aerobic without signs/symptoms of physical distress      Intensity   THRR 40-80% of Max Heartrate 137-158    Ratings of Perceived Exertion 11-13    Perceived Dyspnea 0-4      Progression   Progression Continue to progress workloads to maintain intensity without signs/symptoms of physical distress.      Resistance Training   Training Prescription Yes    Weight 5 lb    Reps 10-15          Perform Capillary Blood Glucose checks as needed.  Exercise Prescription Changes:   Exercise Prescription Changes     Row Name 02/24/24 1100             Response to Exercise   Blood Pressure (Admit) 108/72       Blood Pressure (Exercise) 120/74       Blood Pressure (Exit) 116/74       Heart Rate (Admit) 117 bpm       Heart Rate (Exercise) 117 bpm       Heart Rate (Exit) 118  bpm       Oxygen Saturation (Admit) 95 %       Oxygen Saturation (Exercise) 96 %       Rating of Perceived Exertion (Exercise) 13       Perceived Dyspnea (Exercise) 2       Symptoms lightheadedness       Comments Results          Exercise Comments:   Exercise  Goals and Review:   Exercise Goals     Row Name 02/24/24 1116             Exercise Goals   Increase Physical Activity Yes       Intervention Provide advice, education, support and counseling about physical activity/exercise needs.;Develop an individualized exercise prescription for aerobic and resistive training based on initial evaluation findings, risk stratification, comorbidities and participant's personal goals.       Expected Outcomes Short Term: Attend rehab on a regular basis to increase amount of physical activity.;Long Term: Add in home exercise to make exercise part of routine and to increase amount of physical activity.;Long Term: Exercising regularly at least 3-5 days a week.       Increase Strength and Stamina Yes       Intervention Provide advice, education, support and counseling about physical activity/exercise needs.;Develop an individualized exercise prescription for aerobic and resistive training based on initial evaluation findings, risk stratification, comorbidities and participant's personal goals.       Expected Outcomes Short Term: Perform resistance training exercises routinely during rehab and add in resistance training at home;Short Term: Increase workloads from initial exercise prescription for resistance, speed, and METs.;Long Term: Improve cardiorespiratory fitness, muscular endurance and strength as measured by increased METs and functional capacity ( )       Able to understand and use rate of perceived exertion (RPE) scale Yes       Intervention Provide education and explanation on how to use RPE scale       Expected Outcomes Short Term: Able to use RPE daily in rehab to express subjective  intensity level;Long Term:  Able to use RPE to guide intensity level when exercising independently       Able to understand and use Dyspnea scale Yes       Intervention Provide education and explanation on how to use Dyspnea scale       Expected Outcomes Short Term: Able to use Dyspnea scale daily in rehab to express subjective sense of shortness of breath during exertion;Long Term: Able to use Dyspnea scale to guide intensity level when exercising independently       Knowledge and understanding of Target Heart Rate Range (THRR) Yes       Intervention Provide education and explanation of THRR including how the numbers were predicted and where they are located for reference       Expected Outcomes Short Term: Able to state/look up THRR;Long Term: Able to use THRR to govern intensity when exercising independently;Short Term: Able to use daily as guideline for intensity in rehab       Able to check pulse independently Yes       Intervention Provide education and demonstration on how to check pulse in carotid and radial arteries.;Review the importance of being able to check your own pulse for safety during independent exercise       Expected Outcomes Short Term: Able to explain why pulse checking is important during independent exercise;Long Term: Able to check pulse independently and accurately       Understanding of Exercise Prescription Yes       Intervention Provide education, explanation, and written materials on patient's individual exercise prescription       Expected Outcomes Short Term: Able to explain program exercise prescription;Long Term: Able to explain home exercise prescription to exercise independently          Exercise Goals Re-Evaluation :   Discharge Exercise Prescription (Final Exercise Prescription Changes):  Exercise Prescription Changes - 02/24/24 1100       Response to Exercise   Blood Pressure (Admit) 108/72    Blood Pressure (Exercise) 120/74    Blood Pressure (Exit)  116/74    Heart Rate (Admit) 117 bpm    Heart Rate (Exercise) 117 bpm    Heart Rate (Exit) 118 bpm    Oxygen Saturation (Admit) 95 %    Oxygen Saturation (Exercise) 96 %    Rating of Perceived Exertion (Exercise) 13    Perceived Dyspnea (Exercise) 2    Symptoms lightheadedness    Comments Results          Nutrition:  Target Goals: Understanding of nutrition guidelines, daily intake of sodium 1500mg , cholesterol 200mg , calories 30% from fat and 7% or less from saturated fats, daily to have 5 or more servings of fruits and vegetables.  Education: Nutrition 1 -Group instruction provided by verbal, written material, interactive activities, discussions, models, and posters to present general guidelines for heart healthy nutrition including macronutrients, label reading, and promoting whole foods over processed counterparts. Education serves as pensions consultant of discussion of heart healthy eating for all. Written material provided at class time.    Education: Nutrition 2 -Group instruction provided by verbal, written material, interactive activities, discussions, models, and posters to present general guidelines for heart healthy nutrition including sodium, cholesterol, and saturated fat. Providing guidance of habit forming to improve blood pressure, cholesterol, and body weight. Written material provided at class time.     Biometrics:  Pre Biometrics - 02/24/24 1116       Pre Biometrics   Height 5' 6.5 (1.689 m)    Weight 254 lb 1.6 oz (115.3 kg)    Waist Circumference 48 inches    Hip Circumference 43 inches    Waist to Hip Ratio 1.12 %    BMI (Calculated) 40.4    Single Leg Stand 18.9 seconds           Nutrition Therapy Plan and Nutrition Goals:  Nutrition Therapy & Goals - 02/24/24 1108       Intervention Plan   Intervention Prescribe, educate and counsel regarding individualized specific dietary modifications aiming towards targeted core components such as  weight, hypertension, lipid management, diabetes, heart failure and other comorbidities.    Expected Outcomes Short Term Goal: Understand basic principles of dietary content, such as calories, fat, sodium, cholesterol and nutrients.;Long Term Goal: Adherence to prescribed nutrition plan.;Short Term Goal: A plan has been developed with personal nutrition goals set during dietitian appointment.          Nutrition Assessments:  MEDIFICTS Score Key: >=70 Need to make dietary changes  40-70 Heart Healthy Diet <= 40 Therapeutic Level Cholesterol Diet   Picture Your Plate Scores: <59 Unhealthy dietary pattern with much room for improvement. 41-50 Dietary pattern unlikely to meet recommendations for good health and room for improvement. 51-60 More healthful dietary pattern, with some room for improvement.  >60 Healthy dietary pattern, although there may be some specific behaviors that could be improved.    Nutrition Goals Re-Evaluation:   Nutrition Goals Discharge (Final Nutrition Goals Re-Evaluation):   Psychosocial: Target Goals: Acknowledge presence or absence of significant depression and/or stress, maximize coping skills, provide positive support system. Participant is able to verbalize types and ability to use techniques and skills needed for reducing stress and depression.   Education: Stress, Anxiety, and Depression - Group verbal and visual presentation to define topics covered.  Reviews how body is impacted by  stress, anxiety, and depression.  Also discusses healthy ways to reduce stress and to treat/manage anxiety and depression. Written material provided at class time.   Education: Sleep Hygiene -Provides group verbal and written instruction about how sleep can affect your health.  Define sleep hygiene, discuss sleep cycles and impact of sleep habits. Review good sleep hygiene tips.   Initial Review & Psychosocial Screening:  Initial Psych Review & Screening - 02/23/24  1412       Initial Review   Current issues with None Identified      Family Dynamics   Good Support System? Yes    Comments Patient is coming to cardiac rehab s/p AVR. Patient stated he has recovered well but is looking forward to regaining strength and endurance during cardiac rehab. Patient is interested in losing weight stating he is currently 250lbs and wants to lose 40lbs. Patient stated he quit smoking and drinking prior to surgery (previous hx was smoking and drinking daily). Patient stated he has a great support system of family and friends, including his mom, his girlfriend and his daughter. He currently lives with his widowed mother. Patient stated he thinks he has sleep apnea and has a test scheduled for March 2026. Patient also stated he has issues with his left shoulder and has 3 tears in that area that can cause pain at times. He was also aware of his weight lifting limitations that last through mid/late January. 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 Patient stated good understanding of the medication as well as the do's and don't's related to taking the medication including risk of bleeding. Patient is excited and ready to start cardiac rehab and hopeful for improved health and lifestyle management.      Barriers   Psychosocial barriers to participate in program There are no identifiable barriers or psychosocial needs.      Screening Interventions   Interventions Encouraged to exercise    Expected Outcomes Short Term goal: Identification and review with participant of any Quality of Life or Depression concerns found by scoring the questionnaire.;Long Term goal: The participant improves quality of Life and PHQ9 Scores as seen by post scores and/or verbalization of changes          Quality of Life Scores:   Scores of 19 and below usually indicate a poorer quality of life in these areas.  A difference of  2-3 points is a  clinically meaningful difference.  A difference of 2-3 points in the total score of the Quality of Life Index has been associated with significant improvement in overall quality of life, self-image, physical symptoms, and general health in studies assessing change in quality of life.  PHQ-9: Review Flowsheet       02/24/2024  Depression screen PHQ 2/9  Decreased Interest 1  Down, Depressed, Hopeless 1  PHQ - 2 Score 2  Altered sleeping 2  Tired, decreased energy 3  Change in appetite 1  Feeling bad or failure about yourself  0  Trouble concentrating 0  Moving slowly or fidgety/restless 0  Suicidal thoughts 0  PHQ-9 Score 8  Difficult doing work/chores Somewhat difficult   Interpretation of Total Score  Total Score Depression Severity:  1-4 = Minimal depression, 5-9 = Mild depression, 10-14 = Moderate depression, 15-19 = Moderately severe depression, 20-27 = Severe depression   Psychosocial Evaluation and Intervention:  Psychosocial Evaluation - 02/23/24 1419       Psychosocial Evaluation &  Interventions   Interventions Relaxation education;Encouraged to exercise with the program and follow exercise prescription;Stress management education    Comments Patient is coming to cardiac rehab s/p AVR. Patient stated he has recovered well but is looking forward to regaining strength and endurance during cardiac rehab. Patient is interested in losing weight stating he is currently 250lbs and wants to lose 40lbs. Patient stated he quit smoking and drinking prior to surgery (previous hx was smoking and drinking daily). Patient stated he has a great support system of family and friends, including his mom, his girlfriend and his daughter. He currently lives with his widowed mother. Patient stated he thinks he has sleep apnea and has a test scheduled for March 2026. Patient also stated he has issues with his left shoulder and has 3 tears in that area that can cause pain at times. He was also aware of  his weight lifting limitations that last through mid/late January. 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 Patient stated good understanding of the medication as well as the do's and don't's related to taking the medication including risk of bleeding. Patient is excited and ready to start cardiac rehab and hopeful for improved health and lifestyle management.    Expected Outcomes ST: Attend cardiac rehab for education and exercise. LT: Develop and maintain positive self-care habits    Continue Psychosocial Services  Follow up required by staff          Psychosocial Re-Evaluation:   Psychosocial Discharge (Final Psychosocial Re-Evaluation):   Vocational Rehabilitation: Provide vocational rehab assistance to qualifying candidates.   Vocational Rehab Evaluation & Intervention:  Vocational Rehab - 02/23/24 1412       Initial Vocational Rehab Evaluation & Intervention   Assessment shows need for Vocational Rehabilitation No          Education: Education Goals: Education classes will be provided on a variety of topics geared toward better understanding of heart health and risk factor modification. Participant will state understanding/return demonstration of topics presented as noted by education test scores.  Learning Barriers/Preferences:  Learning Barriers/Preferences - 02/23/24 1412       Learning Barriers/Preferences   Learning Barriers None    Learning Preferences None          General Cardiac Education Topics:  AED/CPR: - Group verbal and written instruction with the use of models to demonstrate the basic use of the AED with the basic ABC's of resuscitation.   Test and Procedures: - Group verbal and visual presentation and models provide information about basic cardiac anatomy and function. Reviews the testing methods done to diagnose heart disease and the outcomes of the test results. Describes the treatment  choices: Medical Management, Angioplasty, or Coronary Bypass Surgery for treating various heart conditions including Myocardial Infarction, Angina, Valve Disease, and Cardiac Arrhythmias. Written material provided at class time.   Medication Safety: - Group verbal and visual instruction to review commonly prescribed medications for heart and lung disease. Reviews the medication, class of the drug, and side effects. Includes the steps to properly store meds and maintain the prescription regimen. Written material provided at class time.   Intimacy: - Group verbal instruction through game format to discuss how heart and lung disease can affect sexual intimacy. Written material provided at class time.   Know Your Numbers and Heart Failure: - Group verbal and visual instruction to discuss disease risk factors for cardiac and pulmonary disease and treatment options.  Reviews  associated critical values for Overweight/Obesity, Hypertension, Cholesterol, and Diabetes.  Discusses basics of heart failure: signs/symptoms and treatments.  Introduces Heart Failure Zone chart for action plan for heart failure. Written material provided at class time.   Infection Prevention: - Provides verbal and written material to individual with discussion of infection control including proper hand washing and proper equipment cleaning during exercise session. Flowsheet Row Cardiac Rehab from 02/24/2024 in [facility] Cardiac and Pulmonary Rehab  Date 02/24/24  Educator NT  Instruction Review Code 1- Verbalizes Understanding    Falls Prevention: - Provides verbal and written material to individual with discussion of falls prevention and safety. Flowsheet Row Cardiac Rehab from 02/24/2024 in [facility] Cardiac and Pulmonary Rehab  Date 02/23/24  Educator kb  Instruction Review Code 1- Verbalizes Understanding    Other: -Provides group and verbal instruction on various topics (see comments)   Knowledge Questionnaire  Score:   Core Components/Risk Factors/Patient Goals at Admission:  Personal Goals and Risk Factors at Admission - 02/23/24 1411       Core Components/Risk Factors/Patient Goals on Admission    Weight Management Weight Loss    Hypertension Yes    Intervention Provide education on lifestyle modifcations including regular physical activity/exercise, weight management, moderate sodium restriction and increased consumption of fresh fruit, vegetables, and low fat dairy, alcohol moderation, and smoking cessation.;Monitor prescription use compliance.    Expected Outcomes Short Term: Continued assessment and intervention until BP is < 140/53mm HG in hypertensive participants. < 130/41mm HG in hypertensive participants with diabetes, heart failure or chronic kidney disease.;Long Term: Maintenance of blood pressure at goal levels.    Lipids Yes    Intervention Provide education and support for participant on nutrition & aerobic/resistive exercise along with prescribed medications to achieve LDL 70mg , HDL >40mg .    Expected Outcomes Short Term: Participant states understanding of desired cholesterol values and is compliant with medications prescribed. Participant is following exercise prescription and nutrition guidelines.;Long Term: Cholesterol controlled with medications as prescribed, with individualized exercise RX and with personalized nutrition plan. Value goals: LDL < 70mg , HDL > 40 mg.          Education:Diabetes - Individual verbal and written instruction to review signs/symptoms of diabetes, desired ranges of glucose level fasting, after meals and with exercise. Acknowledge that pre and post exercise glucose checks will be done for 3 sessions at entry of program.   Core Components/Risk Factors/Patient Goals Review:    Core Components/Risk Factors/Patient Goals at Discharge (Final Review):    ITP Comments:  ITP Comments     Row Name 02/23/24 1405 02/24/24 1105         ITP Comments  Initial phone call completed. Diagnosis can be found in [facility] 01/26/2024. EP Orientation scheduled for Tuesday, February 24, 2024. Completed and gym orientation for cardiac rehab. Initial ITP created and sent for review to Dr. Oneil Pinal, Medical Director.         Comments: Initial ITP    [1]  Current Outpatient Medications:    acetaminophen  (TYLENOL ) 325 MG tablet, Take 975 mg by mouth every 6 (six) hours as needed., Disp: , Rfl:    amiodarone (PACERONE) 200 MG tablet, Take 200 mg by mouth daily., Disp: , Rfl:    aspirin 81 MG chewable tablet, Chew 81 mg by mouth daily., Disp: , Rfl:    cyanocobalamin  (VITAMIN B12) 1000 MCG tablet, Take 1,000 mcg by mouth daily., Disp: , Rfl:    fluticasone (FLONASE) 50 MCG/ACT nasal spray, Place 2  sprays into both nostrils daily. (Patient taking differently: Place 2 sprays into both nostrils daily. As needed), Disp: , Rfl:    fluticasone (FLONASE) 50 MCG/ACT nasal spray, Place 2 sprays into both nostrils as needed., Disp: , Rfl:    furosemide (LASIX) 40 MG tablet, Take 40 mg by mouth daily., Disp: , Rfl:    losartan -hydrochlorothiazide  (HYZAAR) 50-12.5 MG tablet, Take 1 tablet by mouth daily. (Patient not taking: Reported on 02/23/2024), Disp: , Rfl:    Multiple Vitamins-Minerals (MULTIVITAMIN WITH MINERALS) tablet, Take 1 tablet by mouth daily., Disp: , Rfl:    polyethylene glycol (MIRALAX  / GLYCOLAX ) 17 g packet, Take 17 g by mouth daily as needed for mild constipation., Disp: 14 each, Rfl: 0   potassium chloride  SA (KLOR-CON  M) 20 MEQ tablet, Take 20 mEq by mouth 2 (two) times daily., Disp: , Rfl:    senna-docusate (SENOKOT-S) 8.6-50 MG tablet, Take 1 tablet by mouth at bedtime as needed., Disp: , Rfl:    simvastatin (ZOCOR) 40 MG tablet, Take 40 mg by mouth at bedtime., Disp: , Rfl:    warfarin (COUMADIN) 2 MG tablet, Take 2 mg by mouth one time only at 4 PM., Disp: , Rfl:  [2]  Social History Tobacco Use  Smoking Status Every Day   Current  packs/day: 1.50   Types: Cigarettes  Smokeless Tobacco Never

## 2024-03-01 ENCOUNTER — Encounter

## 2024-03-03 ENCOUNTER — Encounter

## 2024-03-04 ENCOUNTER — Encounter

## 2024-03-08 ENCOUNTER — Encounter: Attending: Internal Medicine | Admitting: Emergency Medicine

## 2024-03-08 DIAGNOSIS — Z952 Presence of prosthetic heart valve: Secondary | ICD-10-CM | POA: Insufficient documentation

## 2024-03-08 DIAGNOSIS — Z48812 Encounter for surgical aftercare following surgery on the circulatory system: Secondary | ICD-10-CM | POA: Insufficient documentation

## 2024-03-08 NOTE — Progress Notes (Signed)
 Daily Session Note  Patient Details  Name: Dominic Barton MRN: [id number] Date of Birth: April 21, 1970 Referring Provider:   Flowsheet Row Cardiac Rehab from 02/24/2024 in [facility]  Referring Provider Dr. Annalee Casa, DO    Encounter Date: 03/08/2024  Check In:  Session Check In - 03/08/24 1746       Check-In   Supervising physician immediately available to respond to emergencies See telemetry face sheet for immediately available ER MD    Location [facility]-Cardiac & Pulmonary Rehab    Staff Present Leita Franks RN,BSN;[facility] Dyane BS, ACSM CEP, Exercise Physiologist    Virtual Visit No    Medication changes reported     No    Fall or balance concerns reported    No    Warm-up and Cool-down Performed on first and last piece of equipment    Resistance Training Performed Yes    VAD Patient? No    PAD/SET Patient? No      Pain Assessment   Currently in Pain? No/denies             Tobacco Use History[1]  Goals Met:  Independence with exercise equipment Exercise tolerated well No report of concerns or symptoms today Strength training completed today  Goals Unmet:  Not Applicable  Comments: First full day of exercise!  Patient was oriented to gym and equipment including functions, settings, policies, and procedures.  Patient's individual exercise prescription and treatment plan were reviewed.  All starting workloads were established based on the results of the 6 minute walk test done at initial orientation visit.  The plan for exercise progression was also introduced and progression will be customized based on patient's performance and goals.    Dr. Oneil Pinal is Medical Director for [facility].  Dr. Fuad Aleskerov is Medical Director for [facility].    [1]  Social History Tobacco Use  Smoking Status Every Day   Current packs/day: 1.50   Types: Cigarettes  Smokeless  Tobacco Never

## 2024-03-10 ENCOUNTER — Encounter: Admitting: Emergency Medicine

## 2024-03-10 ENCOUNTER — Encounter

## 2024-03-10 DIAGNOSIS — Z952 Presence of prosthetic heart valve: Secondary | ICD-10-CM

## 2024-03-10 NOTE — Progress Notes (Signed)
 Cardiac Individual Treatment Plan  Patient Details  Name: Dominic Barton MRN: [id number] Date of Birth: 1970-12-29 Referring Provider:   Flowsheet Row Cardiac Rehab from 02/24/2024 in [facility] Cardiac and Pulmonary Rehab  Referring Provider Dr. Annalee Casa, DO    Initial Encounter Date:  Flowsheet Row Cardiac Rehab from 02/24/2024 in [facility] Cardiac and Pulmonary Rehab  Date 02/24/24    Visit Diagnosis: S/P AVR (aortic valve replacement)  Patient's Home Medications on Admission: Current Medications[1]  Past Medical History: Past Medical History:  Diagnosis Date   Anemia 01/2020   bitamin b12 deficiency   Bilateral hand numbness    Excessive daytime sleepiness    Hypertension    Sleep apnea 03/02/2020   in the process of being worked up for sleep apnea   Tendonitis of shoulder, left 2021    Tobacco Use: Tobacco Use History[2]  Labs: Review Flowsheet       Latest Ref Rng & Units 11/09/2023  Labs for ITP Cardiac and Pulmonary Rehab  Cholestrol 0 - 200 mg/dL 834   LDL (calc) 0 - 99 mg/dL 93   HDL-C >59 mg/dL 38   Trlycerides <849 mg/dL 827   Hemoglobin J8r 4.8 - 5.6 % 5.5      Exercise Target Goals: Exercise Program Goal: Individual exercise prescription set using results from initial 6 min walk test and THRR while considering  patients activity barriers and safety.   Exercise Prescription Goal: Initial exercise prescription builds to 30-45 minutes a day of aerobic activity, 2-3 days per week.  Home exercise guidelines will be given to patient during program as part of exercise prescription that the participant will acknowledge.   Education: Aerobic Exercise: - Group verbal and visual presentation on the components of exercise prescription. Introduces F.I.T.T principle from ACSM for exercise prescriptions.  Reviews F.I.T.T. principles of aerobic exercise including progression. Written material provided at class time.   Education: Resistance Exercise: - Group  verbal and visual presentation on the components of exercise prescription. Introduces F.I.T.T principle from ACSM for exercise prescriptions  Reviews F.I.T.T. principles of resistance exercise including progression. Written material provided at class time.    Education: Exercise & Equipment Safety: - Individual verbal instruction and demonstration of equipment use and safety with use of the equipment. Flowsheet Row Cardiac Rehab from 02/24/2024 in [facility] Cardiac and Pulmonary Rehab  Date 02/24/24  Educator NT  Instruction Review Code 1- Verbalizes Understanding    Education: Exercise Physiology & General Exercise Guidelines: - Group verbal and written instruction with models to review the exercise physiology of the cardiovascular system and associated critical values. Provides general exercise guidelines with specific guidelines to those with heart or lung disease. Written material provided at class time.   Education: Flexibility, Balance, Mind/Body Relaxation: - Group verbal and visual presentation with interactive activity on the components of exercise prescription. Introduces F.I.T.T principle from ACSM for exercise prescriptions. Reviews F.I.T.T. principles of flexibility and balance exercise training including progression. Also discusses the mind body connection.  Reviews various relaxation techniques to help reduce and manage stress (i.e. Deep breathing, progressive muscle relaxation, and visualization). Balance handout provided to take home. Written material provided at class time.   Activity Barriers & Risk Stratification:  Activity Barriers & Cardiac Risk Stratification - 02/23/24 1410       Activity Barriers & Cardiac Risk Stratification   Activity Barriers Arthritis;Deconditioning;Other (comment)    Comments per patient: L shoulder has 3 tears    Cardiac Risk Stratification Moderate  6 Minute Walk:  6 Minute Walk     Row Name 02/24/24 1109         6 Minute Walk    Phase Initial     Distance 1385 feet     Walk Time 6 minutes     # of Rest Breaks 0     MPH 2.62     METS 3.41     RPE 13     Perceived Dyspnea  2     VO2 Peak 11.92     Symptoms Yes (comment)     Comments lightheadedness     Resting HR 117 bpm     Resting BP 108/72     Resting Oxygen Saturation  95 %     Exercise Oxygen Saturation  during 6 min walk 96 %     Max Ex. HR 117 bpm     Max Ex. BP 120/74     2 Minute Post BP 116/74        Oxygen Initial Assessment:   Oxygen Re-Evaluation:   Oxygen Discharge (Final Oxygen Re-Evaluation):   Initial Exercise Prescription:  Initial Exercise Prescription - 02/24/24 1100       Date of Initial Exercise RX and Referring Provider   Date 02/24/24    Referring Provider Dr. Sabina Custovic, DO      Oxygen   Maintain Oxygen Saturation 88% or higher      Treadmill   MPH 2.6    Grade 0    Minutes 15    METs 2.99      NuStep   Level 3    SPM 80    Minutes 15    METs 3.41      REL-XR   Level 3    Speed 50    Minutes 15    METs 3.41      Prescription Details   Frequency (times per week) 3    Duration Progress to 30 minutes of continuous aerobic without signs/symptoms of physical distress      Intensity   THRR 40-80% of Max Heartrate 137-158    Ratings of Perceived Exertion 11-13    Perceived Dyspnea 0-4      Progression   Progression Continue to progress workloads to maintain intensity without signs/symptoms of physical distress.      Resistance Training   Training Prescription Yes    Weight 5 lb    Reps 10-15          Perform Capillary Blood Glucose checks as needed.  Exercise Prescription Changes:   Exercise Prescription Changes     Row Name 02/24/24 1100             Response to Exercise   Blood Pressure (Admit) 108/72       Blood Pressure (Exercise) 120/74       Blood Pressure (Exit) 116/74       Heart Rate (Admit) 117 bpm       Heart Rate (Exercise) 117 bpm       Heart Rate (Exit) 118  bpm       Oxygen Saturation (Admit) 95 %       Oxygen Saturation (Exercise) 96 %       Rating of Perceived Exertion (Exercise) 13       Perceived Dyspnea (Exercise) 2       Symptoms lightheadedness       Comments Results          Exercise Comments:   Exercise  Comments     Row Name 03/08/24 1747           Exercise Comments First full day of exercise!  Patient was oriented to gym and equipment including functions, settings, policies, and procedures.  Patient's individual exercise prescription and treatment plan were reviewed.  All starting workloads were established based on the results of the 6 minute walk test done at initial orientation visit.  The plan for exercise progression was also introduced and progression will be customized based on patient's performance and goals.          Exercise Goals and Review:   Exercise Goals     Row Name 02/24/24 1116             Exercise Goals   Increase Physical Activity Yes       Intervention Provide advice, education, support and counseling about physical activity/exercise needs.;Develop an individualized exercise prescription for aerobic and resistive training based on initial evaluation findings, risk stratification, comorbidities and participant's personal goals.       Expected Outcomes Short Term: Attend rehab on a regular basis to increase amount of physical activity.;Long Term: Add in home exercise to make exercise part of routine and to increase amount of physical activity.;Long Term: Exercising regularly at least 3-5 days a week.       Increase Strength and Stamina Yes       Intervention Provide advice, education, support and counseling about physical activity/exercise needs.;Develop an individualized exercise prescription for aerobic and resistive training based on initial evaluation findings, risk stratification, comorbidities and participant's personal goals.       Expected Outcomes Short Term: Perform resistance training  exercises routinely during rehab and add in resistance training at home;Short Term: Increase workloads from initial exercise prescription for resistance, speed, and METs.;Long Term: Improve cardiorespiratory fitness, muscular endurance and strength as measured by increased METs and functional capacity ( )       Able to understand and use rate of perceived exertion (RPE) scale Yes       Intervention Provide education and explanation on how to use RPE scale       Expected Outcomes Short Term: Able to use RPE daily in rehab to express subjective intensity level;Long Term:  Able to use RPE to guide intensity level when exercising independently       Able to understand and use Dyspnea scale Yes       Intervention Provide education and explanation on how to use Dyspnea scale       Expected Outcomes Short Term: Able to use Dyspnea scale daily in rehab to express subjective sense of shortness of breath during exertion;Long Term: Able to use Dyspnea scale to guide intensity level when exercising independently       Knowledge and understanding of Target Heart Rate Range (THRR) Yes       Intervention Provide education and explanation of THRR including how the numbers were predicted and where they are located for reference       Expected Outcomes Short Term: Able to state/look up THRR;Long Term: Able to use THRR to govern intensity when exercising independently;Short Term: Able to use daily as guideline for intensity in rehab       Able to check pulse independently Yes       Intervention Provide education and demonstration on how to check pulse in carotid and radial arteries.;Review the importance of being able to check your own pulse for safety during independent exercise  Expected Outcomes Short Term: Able to explain why pulse checking is important during independent exercise;Long Term: Able to check pulse independently and accurately       Understanding of Exercise Prescription Yes       Intervention  Provide education, explanation, and written materials on patient's individual exercise prescription       Expected Outcomes Short Term: Able to explain program exercise prescription;Long Term: Able to explain home exercise prescription to exercise independently          Exercise Goals Re-Evaluation :  Exercise Goals Re-Evaluation     Row Name 03/08/24 1747             Exercise Goal Re-Evaluation   Exercise Goals Review Increase Physical Activity;Able to understand and use rate of perceived exertion (RPE) scale;Knowledge and understanding of Target Heart Rate Range (THRR);Understanding of Exercise Prescription;Increase Strength and Stamina;Able to understand and use Dyspnea scale;Able to check pulse independently       Comments Reviewed RPE and dyspnea scale, THR and program prescription with pt today.  Pt voiced understanding and was given a copy of goals to take home.       Expected Outcomes Short: Use RPE daily to regulate intensity.  Long: Follow program prescription in THR.          Discharge Exercise Prescription (Final Exercise Prescription Changes):  Exercise Prescription Changes - 02/24/24 1100       Response to Exercise   Blood Pressure (Admit) 108/72    Blood Pressure (Exercise) 120/74    Blood Pressure (Exit) 116/74    Heart Rate (Admit) 117 bpm    Heart Rate (Exercise) 117 bpm    Heart Rate (Exit) 118 bpm    Oxygen Saturation (Admit) 95 %    Oxygen Saturation (Exercise) 96 %    Rating of Perceived Exertion (Exercise) 13    Perceived Dyspnea (Exercise) 2    Symptoms lightheadedness    Comments Results          Nutrition:  Target Goals: Understanding of nutrition guidelines, daily intake of sodium 1500mg , cholesterol 200mg , calories 30% from fat and 7% or less from saturated fats, daily to have 5 or more servings of fruits and vegetables.  Education: Nutrition 1 -Group instruction provided by verbal, written material, interactive activities,  discussions, models, and posters to present general guidelines for heart healthy nutrition including macronutrients, label reading, and promoting whole foods over processed counterparts. Education serves as pensions consultant of discussion of heart healthy eating for all. Written material provided at class time.    Education: Nutrition 2 -Group instruction provided by verbal, written material, interactive activities, discussions, models, and posters to present general guidelines for heart healthy nutrition including sodium, cholesterol, and saturated fat. Providing guidance of habit forming to improve blood pressure, cholesterol, and body weight. Written material provided at class time.     Biometrics:  Pre Biometrics - 02/24/24 1116       Pre Biometrics   Height 5' 6.5 (1.689 m)    Weight 254 lb 1.6 oz (115.3 kg)    Waist Circumference 48 inches    Hip Circumference 43 inches    Waist to Hip Ratio 1.12 %    BMI (Calculated) 40.4    Single Leg Stand 18.9 seconds           Nutrition Therapy Plan and Nutrition Goals:  Nutrition Therapy & Goals - 02/24/24 1108       Intervention Plan   Intervention Prescribe, educate and counsel  regarding individualized specific dietary modifications aiming towards targeted core components such as weight, hypertension, lipid management, diabetes, heart failure and other comorbidities.    Expected Outcomes Short Term Goal: Understand basic principles of dietary content, such as calories, fat, sodium, cholesterol and nutrients.;Long Term Goal: Adherence to prescribed nutrition plan.;Short Term Goal: A plan has been developed with personal nutrition goals set during dietitian appointment.          Nutrition Assessments:  MEDIFICTS Score Key: >=70 Need to make dietary changes  40-70 Heart Healthy Diet <= 40 Therapeutic Level Cholesterol Diet   Picture Your Plate Scores: <59 Unhealthy dietary pattern with much room for improvement. 41-50 Dietary  pattern unlikely to meet recommendations for good health and room for improvement. 51-60 More healthful dietary pattern, with some room for improvement.  >60 Healthy dietary pattern, although there may be some specific behaviors that could be improved.    Nutrition Goals Re-Evaluation:   Nutrition Goals Discharge (Final Nutrition Goals Re-Evaluation):   Psychosocial: Target Goals: Acknowledge presence or absence of significant depression and/or stress, maximize coping skills, provide positive support system. Participant is able to verbalize types and ability to use techniques and skills needed for reducing stress and depression.   Education: Stress, Anxiety, and Depression - Group verbal and visual presentation to define topics covered.  Reviews how body is impacted by stress, anxiety, and depression.  Also discusses healthy ways to reduce stress and to treat/manage anxiety and depression. Written material provided at class time.   Education: Sleep Hygiene -Provides group verbal and written instruction about how sleep can affect your health.  Define sleep hygiene, discuss sleep cycles and impact of sleep habits. Review good sleep hygiene tips.   Initial Review & Psychosocial Screening:  Initial Psych Review & Screening - 02/23/24 1412       Initial Review   Current issues with None Identified      Family Dynamics   Good Support System? Yes    Comments Patient is coming to cardiac rehab s/p AVR. Patient stated he has recovered well but is looking forward to regaining strength and endurance during cardiac rehab. Patient is interested in losing weight stating he is currently 250lbs and wants to lose 40lbs. Patient stated he quit smoking and drinking prior to surgery (previous hx was smoking and drinking daily). Patient stated he has a great support system of family and friends, including his mom, his girlfriend and his daughter. He currently lives with his widowed mother. Patient stated  he thinks he has sleep apnea and has a test scheduled for March 2026. Patient also stated he has issues with his left shoulder and has 3 tears in that area that can cause pain at times. He was also aware of his weight lifting limitations that last through mid/late January. 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 Patient stated good understanding of the medication as well as the do's and don't's related to taking the medication including risk of bleeding. Patient is excited and ready to start cardiac rehab and hopeful for improved health and lifestyle management.      Barriers   Psychosocial barriers to participate in program There are no identifiable barriers or psychosocial needs.      Screening Interventions   Interventions Encouraged to exercise    Expected Outcomes Short Term goal: Identification and review with participant of any Quality of Life or Depression concerns found by scoring the questionnaire.;Long Term goal: The  participant improves quality of Life and PHQ9 Scores as seen by post scores and/or verbalization of changes          Quality of Life Scores:   Scores of 19 and below usually indicate a poorer quality of life in these areas.  A difference of  2-3 points is a clinically meaningful difference.  A difference of 2-3 points in the total score of the Quality of Life Index has been associated with significant improvement in overall quality of life, self-image, physical symptoms, and general health in studies assessing change in quality of life.  PHQ-9: Review Flowsheet       02/24/2024  Depression screen PHQ 2/9  Decreased Interest 1  Down, Depressed, Hopeless 1  PHQ - 2 Score 2  Altered sleeping 2  Tired, decreased energy 3  Change in appetite 1  Feeling bad or failure about yourself  0  Trouble concentrating 0  Moving slowly or fidgety/restless 0  Suicidal thoughts 0  PHQ-9 Score 8  Difficult doing work/chores  Somewhat difficult   Interpretation of Total Score  Total Score Depression Severity:  1-4 = Minimal depression, 5-9 = Mild depression, 10-14 = Moderate depression, 15-19 = Moderately severe depression, 20-27 = Severe depression   Psychosocial Evaluation and Intervention:  Psychosocial Evaluation - 02/23/24 1419       Psychosocial Evaluation & Interventions   Interventions Relaxation education;Encouraged to exercise with the program and follow exercise prescription;Stress management education    Comments Patient is coming to cardiac rehab s/p AVR. Patient stated he has recovered well but is looking forward to regaining strength and endurance during cardiac rehab. Patient is interested in losing weight stating he is currently 250lbs and wants to lose 40lbs. Patient stated he quit smoking and drinking prior to surgery (previous hx was smoking and drinking daily). Patient stated he has a great support system of family and friends, including his mom, his girlfriend and his daughter. He currently lives with his widowed mother. Patient stated he thinks he has sleep apnea and has a test scheduled for March 2026. Patient also stated he has issues with his left shoulder and has 3 tears in that area that can cause pain at times. He was also aware of his weight lifting limitations that last through mid/late January. 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 Patient stated good understanding of the medication as well as the do's and don't's related to taking the medication including risk of bleeding. Patient is excited and ready to start cardiac rehab and hopeful for improved health and lifestyle management.    Expected Outcomes ST: Attend cardiac rehab for education and exercise. LT: Develop and maintain positive self-care habits    Continue Psychosocial Services  Follow up required by staff          Psychosocial Re-Evaluation:   Psychosocial Discharge  (Final Psychosocial Re-Evaluation):   Vocational Rehabilitation: Provide vocational rehab assistance to qualifying candidates.   Vocational Rehab Evaluation & Intervention:  Vocational Rehab - 02/23/24 1412       Initial Vocational Rehab Evaluation & Intervention   Assessment shows need for Vocational Rehabilitation No          Education: Education Goals: Education classes will be provided on a variety of topics geared toward better understanding of heart health and risk factor modification. Participant will state understanding/return demonstration of topics presented as noted by education test scores.  Learning Barriers/Preferences:  Learning Barriers/Preferences - 02/23/24  1412       Learning Barriers/Preferences   Learning Barriers None    Learning Preferences None          General Cardiac Education Topics:  AED/CPR: - Group verbal and written instruction with the use of models to demonstrate the basic use of the AED with the basic ABC's of resuscitation.   Test and Procedures: - Group verbal and visual presentation and models provide information about basic cardiac anatomy and function. Reviews the testing methods done to diagnose heart disease and the outcomes of the test results. Describes the treatment choices: Medical Management, Angioplasty, or Coronary Bypass Surgery for treating various heart conditions including Myocardial Infarction, Angina, Valve Disease, and Cardiac Arrhythmias. Written material provided at class time.   Medication Safety: - Group verbal and visual instruction to review commonly prescribed medications for heart and lung disease. Reviews the medication, class of the drug, and side effects. Includes the steps to properly store meds and maintain the prescription regimen. Written material provided at class time.   Intimacy: - Group verbal instruction through game format to discuss how heart and lung disease can affect sexual intimacy. Written  material provided at class time.   Know Your Numbers and Heart Failure: - Group verbal and visual instruction to discuss disease risk factors for cardiac and pulmonary disease and treatment options.  Reviews associated critical values for Overweight/Obesity, Hypertension, Cholesterol, and Diabetes.  Discusses basics of heart failure: signs/symptoms and treatments.  Introduces Heart Failure Zone chart for action plan for heart failure. Written material provided at class time.   Infection Prevention: - Provides verbal and written material to individual with discussion of infection control including proper hand washing and proper equipment cleaning during exercise session. Flowsheet Row Cardiac Rehab from 02/24/2024 in [facility] Cardiac and Pulmonary Rehab  Date 02/24/24  Educator NT  Instruction Review Code 1- Verbalizes Understanding    Falls Prevention: - Provides verbal and written material to individual with discussion of falls prevention and safety. Flowsheet Row Cardiac Rehab from 02/24/2024 in [facility] Cardiac and Pulmonary Rehab  Date 02/23/24  Educator kb  Instruction Review Code 1- Verbalizes Understanding    Other: -Provides group and verbal instruction on various topics (see comments)   Knowledge Questionnaire Score:   Core Components/Risk Factors/Patient Goals at Admission:  Personal Goals and Risk Factors at Admission - 02/23/24 1411       Core Components/Risk Factors/Patient Goals on Admission    Weight Management Weight Loss    Hypertension Yes    Intervention Provide education on lifestyle modifcations including regular physical activity/exercise, weight management, moderate sodium restriction and increased consumption of fresh fruit, vegetables, and low fat dairy, alcohol moderation, and smoking cessation.;Monitor prescription use compliance.    Expected Outcomes Short Term: Continued assessment and intervention until BP is < 140/19mm HG in hypertensive participants. <  130/64mm HG in hypertensive participants with diabetes, heart failure or chronic kidney disease.;Long Term: Maintenance of blood pressure at goal levels.    Lipids Yes    Intervention Provide education and support for participant on nutrition & aerobic/resistive exercise along with prescribed medications to achieve LDL 70mg , HDL >40mg .    Expected Outcomes Short Term: Participant states understanding of desired cholesterol values and is compliant with medications prescribed. Participant is following exercise prescription and nutrition guidelines.;Long Term: Cholesterol controlled with medications as prescribed, with individualized exercise RX and with personalized nutrition plan. Value goals: LDL < 70mg , HDL > 40 mg.  Education:Diabetes - Individual verbal and written instruction to review signs/symptoms of diabetes, desired ranges of glucose level fasting, after meals and with exercise. Acknowledge that pre and post exercise glucose checks will be done for 3 sessions at entry of program.   Core Components/Risk Factors/Patient Goals Review:    Core Components/Risk Factors/Patient Goals at Discharge (Final Review):    ITP Comments:  ITP Comments     Row Name 02/23/24 1405 02/24/24 1105 03/08/24 1747 03/10/24 0736     ITP Comments Initial phone call completed. Diagnosis can be found in [facility] 01/26/2024. EP Orientation scheduled for Tuesday, February 24, 2024. Completed and gym orientation for cardiac rehab. Initial ITP created and sent for review to Dr. Oneil Pinal, Medical Director. First full day of exercise!  Patient was oriented to gym and equipment including functions, settings, policies, and procedures.  Patient's individual exercise prescription and treatment plan were reviewed.  All starting workloads were established based on the results of the 6 minute walk test done at initial orientation visit.  The plan for exercise progression was also introduced and progression will be  customized based on patient's performance and goals. 30 Day review completed. Medical Director ITP review done, changes made as directed, and signed approval by Medical Director. New to program.       Comments: 30 day review     [1]  Current Outpatient Medications:    acetaminophen  (TYLENOL ) 325 MG tablet, Take 975 mg by mouth every 6 (six) hours as needed., Disp: , Rfl:    amiodarone (PACERONE) 200 MG tablet, Take 200 mg by mouth daily., Disp: , Rfl:    aspirin 81 MG chewable tablet, Chew 81 mg by mouth daily., Disp: , Rfl:    cyanocobalamin  (VITAMIN B12) 1000 MCG tablet, Take 1,000 mcg by mouth daily., Disp: , Rfl:    fluticasone (FLONASE) 50 MCG/ACT nasal spray, Place 2 sprays into both nostrils daily. (Patient taking differently: Place 2 sprays into both nostrils daily. As needed), Disp: , Rfl:    fluticasone (FLONASE) 50 MCG/ACT nasal spray, Place 2 sprays into both nostrils as needed., Disp: , Rfl:    furosemide (LASIX) 40 MG tablet, Take 40 mg by mouth daily., Disp: , Rfl:    losartan -hydrochlorothiazide  (HYZAAR) 50-12.5 MG tablet, Take 1 tablet by mouth daily. (Patient not taking: Reported on 02/23/2024), Disp: , Rfl:    Multiple Vitamins-Minerals (MULTIVITAMIN WITH MINERALS) tablet, Take 1 tablet by mouth daily., Disp: , Rfl:    polyethylene glycol (MIRALAX  / GLYCOLAX ) 17 g packet, Take 17 g by mouth daily as needed for mild constipation., Disp: 14 each, Rfl: 0   potassium chloride  SA (KLOR-CON  M) 20 MEQ tablet, Take 20 mEq by mouth 2 (two) times daily., Disp: , Rfl:    senna-docusate (SENOKOT-S) 8.6-50 MG tablet, Take 1 tablet by mouth at bedtime as needed., Disp: , Rfl:    simvastatin (ZOCOR) 40 MG tablet, Take 40 mg by mouth at bedtime., Disp: , Rfl:    warfarin (COUMADIN) 2 MG tablet, Take 2 mg by mouth one time only at 4 PM., Disp: , Rfl:  [2]  Social History Tobacco Use  Smoking Status Every Day   Current packs/day: 1.50   Types: Cigarettes  Smokeless Tobacco Never

## 2024-03-10 NOTE — Progress Notes (Signed)
 Assessment start time: 4:25 PM  Digestive issues/concerns: no known food allergies   24-hours Recall: B: cheese toast Snack: biscuit L: sandwich, mayby some chips ahoy minis D: pizza or chicken wings OR grilled chicken,   Beverages diet soft drinks, sweet tea, gatorade zero, water   Education r/t nutrition plan Colon is drinking mostly diet soda. He would like to cut back on it some and drink more water . Set goal to do so. Reviewed mediterranean diet handout, educated on types of fats, sources and how to read labels. Provided guideline limits of less than 1500mg  sodium and less than 12g saturated fat. Brainstormed several meals and snacks with foods he likes and will eat focusing on more colorful produce and reducing sodium, saturate fat and processed foods. He wants to make changes but may take some time for changes to happen and stay consistent in his routine. Will continue to monitor and support as needed.     Goal 1: Include more colorful produce, aim for 5-8 servings of fruits and veggies per day Goal 2: Read labels and reduce sodium intake to below 2300mg . Ideally 1500mg  per day.  Goal 3: Drink more water  than diet soda  End time 5:16 PM

## 2024-03-10 NOTE — Progress Notes (Signed)
 Daily Session Note  Patient Details  Name: Dominic Barton MRN: [id number] Date of Birth: 01/03/1971 Referring Provider:   Flowsheet Row Cardiac Rehab from 02/24/2024 in [facility] Cardiac and Pulmonary Rehab  Referring Provider Dr. Annalee Casa, DO    Encounter Date: 03/10/2024  Check In:  Session Check In - 03/10/24 1707       Check-In   Supervising physician immediately available to respond to emergencies See telemetry face sheet for immediately available ER MD    Location [facility]-Cardiac & Pulmonary Rehab    Staff Present Fairy Plater RCP,RRT,BSRT;Shila Kruczek RN,BSN;Kelly Dyane BS, ACSM CEP, Exercise Physiologist    Virtual Visit No    Medication changes reported     No    Fall or balance concerns reported    No    Warm-up and Cool-down Performed on first and last piece of equipment    Resistance Training Performed Yes    VAD Patient? No    PAD/SET Patient? No      Pain Assessment   Currently in Pain? No/denies             Tobacco Use History[1]  Goals Met:  Independence with exercise equipment Exercise tolerated well No report of concerns or symptoms today Strength training completed today  Goals Unmet:  Not Applicable  Comments: Pt able to follow exercise prescription today without complaint.  Will continue to monitor for progression.    Dr. Oneil Pinal is Medical Director for [facility] Cardiac Rehabilitation.  Dr. Fuad Aleskerov is Medical Director for [facility] Pulmonary Rehabilitation.    [1]  Social History Tobacco Use  Smoking Status Every Day   Current packs/day: 1.50   Types: Cigarettes  Smokeless Tobacco Never

## 2024-03-11 ENCOUNTER — Encounter

## 2024-03-15 ENCOUNTER — Encounter

## 2024-03-17 ENCOUNTER — Encounter

## 2024-03-17 DIAGNOSIS — Z952 Presence of prosthetic heart valve: Secondary | ICD-10-CM

## 2024-03-17 NOTE — Progress Notes (Signed)
 Daily Session Note  Patient Details  Name: Dominic Barton MRN: [id number] Date of Birth: [birth year] Referring Provider:   Flowsheet Row Cardiac Rehab from 02/24/2024 in [facility] Cardiac and Pulmonary Rehab  Referring Provider Dr. Annalee Casa, DO    Encounter Date: 03/17/2024  Check In:  Session Check In - 03/17/24 1704       Check-In   Supervising physician immediately available to respond to emergencies See telemetry face sheet for immediately available ER MD    Location [facility]-Cardiac & Pulmonary Rehab    Staff Present Burnard Davenport RN,BSN,MPA;Joseph Rolinda RCP,RRT,BSRT;Laura Cates RN,BSN;Rishith Siddoway Dyane BS, ACSM CEP, Exercise Physiologist    Virtual Visit No    Medication changes reported     No    Fall or balance concerns reported    No    Warm-up and Cool-down Performed on first and last piece of equipment    Resistance Training Performed Yes    VAD Patient? No    PAD/SET Patient? No      Pain Assessment   Currently in Pain? No/denies             Tobacco Use History[1]  Goals Met:  Independence with exercise equipment Exercise tolerated well No report of concerns or symptoms today Strength training completed today  Goals Unmet:  Not Applicable  Comments: Pt able to follow exercise prescription today without complaint.  Will continue to monitor for progression.    Dr. Oneil Pinal is Medical Director for [facility] Cardiac Rehabilitation.  Dr. Fuad Aleskerov is Medical Director for [facility] Pulmonary Rehabilitation.    [1]  Social History Tobacco Use  Smoking Status Every Day   Current packs/day: 1.50   Types: Cigarettes  Smokeless Tobacco Never

## 2024-03-18 ENCOUNTER — Encounter: Admitting: Emergency Medicine

## 2024-03-18 DIAGNOSIS — Z952 Presence of prosthetic heart valve: Secondary | ICD-10-CM

## 2024-03-18 NOTE — Progress Notes (Signed)
 Daily Session Note  Patient Details  Name: Dominic Barton MRN: [id number] Date of Birth: 10/16/1970 Referring Provider:   Flowsheet Row Cardiac Rehab from 02/24/2024 in [facility] Cardiac and Pulmonary Rehab  Referring Provider Dr. Annalee Casa, DO    Encounter Date: 03/18/2024  Check In:  Session Check In - 03/18/24 1714       Check-In   Supervising physician immediately available to respond to emergencies See telemetry face sheet for immediately available ER MD    Location [facility]-Cardiac & Pulmonary Rehab    Staff Present Leita Franks RN,BSN;Joseph [facility] RCP,RRT,BSRT;Margaret Best, MS, Exercise Physiologist    Virtual Visit No    Medication changes reported     No    Fall or balance concerns reported    No    Warm-up and Cool-down Performed on first and last piece of equipment    Resistance Training Performed Yes    VAD Patient? No    PAD/SET Patient? No      Pain Assessment   Currently in Pain? No/denies             Tobacco Use History[1]  Goals Met:  Independence with exercise equipment Exercise tolerated well No report of concerns or symptoms today Strength training completed today  Goals Unmet:  Not Applicable  Comments: Pt able to follow exercise prescription today without complaint.  Will continue to monitor for progression.    Dr. Oneil Pinal is Medical Director for [facility] Cardiac Rehabilitation.  Dr. Fuad Aleskerov is Medical Director for [facility] Pulmonary Rehabilitation.    [1]  Social History Tobacco Use  Smoking Status Every Day   Current packs/day: 1.50   Types: Cigarettes  Smokeless Tobacco Never

## 2024-03-22 ENCOUNTER — Encounter

## 2024-03-24 ENCOUNTER — Encounter: Admitting: Emergency Medicine

## 2024-03-24 DIAGNOSIS — Z952 Presence of prosthetic heart valve: Secondary | ICD-10-CM

## 2024-03-24 NOTE — Progress Notes (Signed)
 Daily Session Note  Patient Details  Name: Dominic Barton MRN: [id number] Date of Birth: 1971-02-13 Referring Provider:   Flowsheet Row Cardiac Rehab from 02/24/2024 in [facility] Cardiac and Pulmonary Rehab  Referring Provider Dr. Annalee Casa, DO    Encounter Date: 03/24/2024  Check In:  Session Check In - 03/24/24 1723       Check-In   Supervising physician immediately available to respond to emergencies See telemetry face sheet for immediately available ER MD    Location [facility]-Cardiac & Pulmonary Rehab    Staff Present Leita Franks RN,BSN;Joseph [facility] Dyane BS, ACSM CEP, Exercise Physiologist    Virtual Visit No    Medication changes reported     No    Fall or balance concerns reported    No    Warm-up and Cool-down Performed on first and last piece of equipment    Resistance Training Performed Yes    VAD Patient? No    PAD/SET Patient? No      Pain Assessment   Currently in Pain? No/denies             Tobacco Use History[1]  Goals Met:  Independence with exercise equipment Exercise tolerated well Personal goals reviewed No report of concerns or symptoms today Strength training completed today  Goals Unmet:  Not Applicable  Comments: Pt able to follow exercise prescription today without complaint.  Will continue to monitor for progression.    Dr. Oneil Pinal is Medical Director for [facility] Cardiac Rehabilitation.  Dr. Fuad Aleskerov is Medical Director for [facility] Pulmonary Rehabilitation.     [1]  Social History Tobacco Use  Smoking Status Every Day   Current packs/day: 1.50   Types: Cigarettes  Smokeless Tobacco Never

## 2024-03-25 ENCOUNTER — Encounter: Admitting: Emergency Medicine

## 2024-03-25 DIAGNOSIS — Z952 Presence of prosthetic heart valve: Secondary | ICD-10-CM

## 2024-03-25 NOTE — Progress Notes (Signed)
 Daily Session Note  Patient Details  Name: Dominic Barton MRN: [id number] Date of Birth: 10/20/1970 Referring Provider:   Flowsheet Row Cardiac Rehab from 02/24/2024 in [facility] Cardiac and Pulmonary Rehab  Referring Provider Dr. Annalee Casa, DO    Encounter Date: 03/25/2024  Check In:  Session Check In - 03/25/24 1722       Check-In   Supervising physician immediately available to respond to emergencies See telemetry face sheet for immediately available ER MD    Location [facility]-Cardiac & Pulmonary Rehab    Staff Present Leita Franks RN,BSN;Joseph [facility] BS, Exercise Physiologist    Virtual Visit No    Medication changes reported     No    Fall or balance concerns reported    No    Warm-up and Cool-down Performed on first and last piece of equipment    Resistance Training Performed Yes    VAD Patient? No    PAD/SET Patient? No      Pain Assessment   Currently in Pain? No/denies             Tobacco Use History[1]  Goals Met:  Independence with exercise equipment Exercise tolerated well No report of concerns or symptoms today Strength training completed today  Goals Unmet:  Not Applicable  Comments: Pt able to follow exercise prescription today without complaint.  Will continue to monitor for progression.    Dr. Oneil Pinal is Medical Director for [facility] Cardiac Rehabilitation.  Dr. Fuad Aleskerov is Medical Director for [facility] Pulmonary Rehabilitation.    [1]  Social History Tobacco Use  Smoking Status Every Day   Current packs/day: 1.50   Types: Cigarettes  Smokeless Tobacco Never

## 2024-03-29 ENCOUNTER — Encounter

## 2024-03-31 ENCOUNTER — Encounter: Attending: Internal Medicine

## 2024-04-01 ENCOUNTER — Encounter: Attending: Internal Medicine

## 2024-04-05 ENCOUNTER — Encounter: Attending: Internal Medicine

## 2024-04-07 ENCOUNTER — Encounter

## 2024-04-08 ENCOUNTER — Encounter

## 2024-04-12 ENCOUNTER — Encounter

## 2024-04-14 ENCOUNTER — Encounter

## 2024-04-15 ENCOUNTER — Encounter

## 2024-04-19 ENCOUNTER — Encounter

## 2024-04-21 ENCOUNTER — Encounter

## 2024-04-22 ENCOUNTER — Encounter

## 2024-04-26 ENCOUNTER — Encounter: Attending: Internal Medicine

## 2024-04-28 ENCOUNTER — Encounter

## 2024-04-29 ENCOUNTER — Encounter

## 2024-05-03 ENCOUNTER — Encounter

## 2024-05-05 ENCOUNTER — Encounter

## 2024-05-06 ENCOUNTER — Encounter

## 2024-05-10 ENCOUNTER — Encounter

## 2024-05-12 ENCOUNTER — Encounter

## 2024-05-13 ENCOUNTER — Encounter

## 2024-05-17 ENCOUNTER — Encounter

## 2024-05-19 ENCOUNTER — Encounter

## 2024-05-20 ENCOUNTER — Encounter

## 2024-05-24 ENCOUNTER — Encounter
